# Patient Record
Sex: Female | Born: 1935 | Race: Black or African American | Hispanic: No | Smoking: Former smoker
Health system: Southern US, Community
[De-identification: ages and names within clinical notes are randomized; demographics above are authoritative.]

## PROBLEM LIST (undated history)

## (undated) DIAGNOSIS — I5189 Other ill-defined heart diseases: Secondary | ICD-10-CM

## (undated) DIAGNOSIS — I517 Cardiomegaly: Secondary | ICD-10-CM

## (undated) DIAGNOSIS — E785 Hyperlipidemia, unspecified: Secondary | ICD-10-CM

## (undated) DIAGNOSIS — I452 Bifascicular block: Secondary | ICD-10-CM

## (undated) DIAGNOSIS — I10 Essential (primary) hypertension: Secondary | ICD-10-CM

## (undated) HISTORY — DX: Cardiomegaly: I51.7

## (undated) HISTORY — DX: Other ill-defined heart diseases: I51.89

## (undated) HISTORY — DX: Bifascicular block: I45.2

## (undated) HISTORY — DX: Essential (primary) hypertension: I10

## (undated) HISTORY — DX: Hyperlipidemia, unspecified: E78.5

---

## 2005-03-07 HISTORY — PX: TRANSTHORACIC ECHOCARDIOGRAM: SHX275

## 2012-08-04 ENCOUNTER — Other Ambulatory Visit: Payer: Self-pay | Admitting: Internal Medicine

## 2012-08-26 ENCOUNTER — Other Ambulatory Visit: Payer: Self-pay | Admitting: Internal Medicine

## 2012-08-28 NOTE — Telephone Encounter (Signed)
Rx was sent to pharmacy electronically. 

## 2012-08-31 ENCOUNTER — Other Ambulatory Visit: Payer: Self-pay | Admitting: Internal Medicine

## 2012-09-03 ENCOUNTER — Other Ambulatory Visit: Payer: Self-pay | Admitting: Internal Medicine

## 2012-09-03 NOTE — Telephone Encounter (Signed)
Rx was sent to pharmacy electronically. 

## 2013-06-05 ENCOUNTER — Encounter: Payer: Self-pay | Admitting: *Deleted

## 2013-06-12 ENCOUNTER — Encounter: Payer: Self-pay | Admitting: Internal Medicine

## 2013-06-12 ENCOUNTER — Ambulatory Visit (INDEPENDENT_AMBULATORY_CARE_PROVIDER_SITE_OTHER): Payer: Medicare Other | Admitting: Internal Medicine

## 2013-06-12 VITALS — BP 120/62 | HR 78 | Ht 65.0 in | Wt 156.2 lb

## 2013-06-12 DIAGNOSIS — E785 Hyperlipidemia, unspecified: Secondary | ICD-10-CM

## 2013-06-12 DIAGNOSIS — I10 Essential (primary) hypertension: Secondary | ICD-10-CM

## 2013-06-12 DIAGNOSIS — I491 Atrial premature depolarization: Secondary | ICD-10-CM | POA: Insufficient documentation

## 2013-06-12 MED ORDER — VALSARTAN-HYDROCHLOROTHIAZIDE 320-25 MG PO TABS: ORAL_TABLET | ORAL | Status: DC

## 2013-06-12 MED ORDER — METOPROLOL SUCCINATE ER 25 MG PO TB24: ORAL_TABLET | ORAL | Status: DC

## 2013-06-12 MED ORDER — AMLODIPINE BESYLATE 10 MG PO TABS: ORAL_TABLET | ORAL | Status: DC

## 2013-06-12 NOTE — Patient Instructions (Signed)
Your physician wants you to follow-up in: 1 year. You will receive a reminder letter in the mail two months in advance. If you don't receive a letter, please call our office to schedule the follow-up appointment.  

## 2013-06-12 NOTE — Progress Notes (Signed)
OFFICE NOTE  Chief Complaint:  Routine follow-up  Primary Care Physician: No PCP Per Patient  HPI:  Gloria Odom  is a pleasant, 80 year old female. No primary care doctor. She has a history of hypertension, LVH, and diastolic dysfunction. Overall, she has done incredibly well without any hospitalizations for diastolic heart failure. She does have underlying right bifascicular block with left axis deviation of -51, which has been stable. She denies any chest pain, worsening shortness of breath, palpitations, presyncope, or syncopal symptoms. She occasionally does have some right hip pain, which she relates that it improved with Advil. Overall, for her age, she is doing remarkably well.  She is very sharp mentally!  PMHx:  Past Medical History  Diagnosis Date  . Hypertension   . LVH (left ventricular hypertrophy)   . Diastolic dysfunction   . Bifascicular block   . Dyslipidemia     Past Surgical History  Procedure Laterality Date  . Transthoracic echocardiogram  2007    EF normal; mild MR & TR; mild AV regurg & AV moderately sclerotic    FAMHx:  Family History  Problem Relation Age of Onset  . Diabetes Mother   . Kidney failure Mother   . Emphysema Father   . Stroke Maternal Grandmother   . Stroke Maternal Grandfather   . Emphysema Brother   . Asthma Sister   . Cancer Sister   . Hyperlipidemia Sister   . Heart attack Child     SOCHx:   reports that she has quit smoking. She does not have any smokeless tobacco history on file. Her alcohol and drug histories are not on file.  ALLERGIES:  No Known Allergies  ROS: A comprehensive review of systems was negative.  HOME MEDS: Current Outpatient Prescriptions  Medication Sig Dispense Refill  . amLODipine (NORVASC) 10 MG tablet TAKE 1 TABLET EVERY DAY  90 tablet  3  . aspirin 81 MG tablet Take 81 mg by mouth daily as needed.       . metoprolol succinate (TOPROL-XL) 25 MG 24 hr tablet Take 1 tablet by mouth every  day.  90 tablet  3  . Multiple Vitamin (MULTIVITAMIN) capsule Take 1 capsule by mouth daily.      . valsartan-hydrochlorothiazide (DIOVAN-HCT) 320-25 MG per tablet TAKE 1 TABLET EVERY DAY  90 tablet  3   No current facility-administered medications for this visit.    LABS/IMAGING: No results found for this or any previous visit (from the past 48 hour(s)). No results found.  VITALS: BP 120/62  Pulse 78  Ht 5\' 5"  (1.651 m)  Wt 156 lb 3.2 oz (70.852 kg)  BMI 25.99 kg/m2  EXAM: General appearance: alert and no distress Neck: no carotid bruit and no JVD Lungs: clear to auscultation bilaterally Heart: regular rate and rhythm, S1, S2 normal, no murmur, click, rub or gallop Abdomen: soft, non-tender; bowel sounds normal; no masses,  no organomegaly Extremities: extremities normal, atraumatic, no cyanosis or edema Pulses: 2+ and symmetric Skin: Skin color, texture, turgor normal. No rashes or lesions Neurologic: Grossly normal Psych: Mood, affect normal  EKG: Sinus rhythm with PACs, bifascicular block at 78  ASSESSMENT: 1. New PACs 2. Hypertension-controlled 3. Dyslipidemia-diet controlled  PLAN: 1.   Gloria Odom is doing extremely well. Her blood pressure has been well-controlled. She does have PACs on EKG today but she is unaware of this. This oftentimes can precede the development of atrial fibrillation and I asked her to watch for new or worsening fatigue.  Overall she's doing great and really has no complaints. She just turned 89 last year and continues to be very mentally sharp. Plan to see her back annually or sooner as necessary.  Gloria NoseKenneth C. Devonne Kitchen, MD, [facility]ACC Attending Cardiologist [facility]  Gloria NoseKenneth C. Laiya Odom 06/12/2013, 2:21 PM

## 2013-08-27 ENCOUNTER — Other Ambulatory Visit: Payer: Self-pay | Admitting: Internal Medicine

## 2014-01-20 ENCOUNTER — Encounter: Payer: Self-pay | Admitting: Cardiovascular Disease

## 2014-06-16 ENCOUNTER — Ambulatory Visit (INDEPENDENT_AMBULATORY_CARE_PROVIDER_SITE_OTHER): Payer: Medicare Other | Admitting: Internal Medicine

## 2014-06-16 ENCOUNTER — Encounter: Payer: Self-pay | Admitting: Internal Medicine

## 2014-06-16 VITALS — BP 108/68 | HR 71 | Ht 64.0 in | Wt 158.4 lb

## 2014-06-16 DIAGNOSIS — E785 Hyperlipidemia, unspecified: Secondary | ICD-10-CM

## 2014-06-16 DIAGNOSIS — I491 Atrial premature depolarization: Secondary | ICD-10-CM

## 2014-06-16 DIAGNOSIS — I10 Essential (primary) hypertension: Secondary | ICD-10-CM

## 2014-06-16 NOTE — Progress Notes (Signed)
OFFICE NOTE  Chief Complaint:  Routine follow-up, occasional lightheadedness and dizziness  Primary Care Physician: No PCP Per Patient  HPI:  Gloria Odom  is a pleasant, 81 year old female. No primary care doctor. She has a history of hypertension, LVH, and diastolic dysfunction. Overall, she has done incredibly well without any hospitalizations for diastolic heart failure. She does have underlying right bifascicular block with left axis deviation of -51, which has been stable. She denies any chest pain, worsening shortness of breath, palpitations, presyncope, or syncopal symptoms. She occasionally does have some right hip pain, which she relates that it improved with Advil. Overall, for her age, she is doing remarkably well.  She is very sharp mentally!  Gloria Odom returns today. She is doing incredibly well. Her birthday is coming up in a few months and she'll be 89. She denies any chest pain or worsening shortness of breath. She occasionally gets some lightheadedness and dizziness. Blood pressure was low today at 108/68.  PMHx:  Past Medical History  Diagnosis Date  . Hypertension   . LVH (left ventricular hypertrophy)   . Diastolic dysfunction   . Bifascicular block   . Dyslipidemia     Past Surgical History  Procedure Laterality Date  . Transthoracic echocardiogram  2007    EF normal; mild MR & TR; mild AV regurg & AV moderately sclerotic    FAMHx:  Family History  Problem Relation Age of Onset  . Diabetes Mother   . Kidney failure Mother   . Emphysema Father   . Stroke Maternal Grandmother   . Stroke Maternal Grandfather   . Emphysema Brother   . Asthma Sister   . Cancer Sister   . Hyperlipidemia Sister   . Heart attack Child     SOCHx:   reports that she has quit smoking. She does not have any smokeless tobacco history on file. Her alcohol and drug histories are not on file.  ALLERGIES:  No Known Allergies  ROS: A comprehensive review of systems  was negative.  HOME MEDS: Current Outpatient Prescriptions  Medication Sig Dispense Refill  . amLODipine (NORVASC) 10 MG tablet Take 5 mg by mouth daily.    Marland Kitchen. aspirin 81 MG tablet Take 81 mg by mouth daily as needed.     . metoprolol succinate (TOPROL-XL) 25 MG 24 hr tablet Take 1 tablet by mouth every day. 90 tablet 3  . Multiple Vitamin (MULTIVITAMIN) capsule Take 1 capsule by mouth daily.    . valsartan-hydrochlorothiazide (DIOVAN-HCT) 320-25 MG per tablet TAKE 1 TABLET EVERY DAY 90 tablet 3   No current facility-administered medications for this visit.    LABS/IMAGING: No results found for this or any previous visit (from the past 48 hour(s)). No results found.  VITALS: BP 108/68 mmHg  Pulse 71  Ht 5\' 4"  (1.626 m)  Wt 158 lb 6.4 oz (71.85 kg)  BMI 27.18 kg/m2  EXAM: General appearance: alert and no distress Neck: no carotid bruit and no JVD Lungs: clear to auscultation bilaterally Heart: regular rate and rhythm, S1, S2 normal, no murmur, click, rub or gallop Abdomen: soft, non-tender; bowel sounds normal; no masses,  no organomegaly Extremities: extremities normal, atraumatic, no cyanosis or edema Pulses: 2+ and symmetric Skin: Skin color, texture, turgor normal. No rashes or lesions Neurologic: Grossly normal Psych: Mood, affect normal  EKG: Sinus rhythm with PACs, bifascicular block at 71  ASSESSMENT: 1. New PACs - bifascicular block 2. Hypertension-controlled 3. Dyslipidemia-diet controlled  PLAN: 1.  Gloria Odom is doing extremely well. Blood pressure is low normal today. She occasionally gets some lightheadedness and dizziness. I'm concerned about low blood pressures at a computer fall risk. I like to decrease her amlodipine to 5 mg daily. We'll continue her other medications. I think she is doing very well given her age overall. Plan to see her back annually or sooner as necessary.  Chrystie Nose, MD, [facility] Attending Cardiologist [facility]  HILTY,Kenneth C 06/16/2014, 2:11 PM

## 2014-06-16 NOTE — Patient Instructions (Signed)
Your physician has recommended you make the following change in your medication: decrease amlodipine to 5mg  once daily   Your physician wants you to follow-up in: 1 year with Dr. Rennis GoldenHilty. You will receive a reminder letter in the mail two months in advance. If you don't receive a letter, please call our office to schedule the follow-up appointment.

## 2014-07-30 ENCOUNTER — Other Ambulatory Visit: Payer: Self-pay | Admitting: Internal Medicine

## 2014-07-30 NOTE — Telephone Encounter (Signed)
Rx has been sent to the pharmacy electronically. ° °

## 2014-08-22 ENCOUNTER — Other Ambulatory Visit: Payer: Self-pay | Admitting: Internal Medicine

## 2014-08-22 NOTE — Telephone Encounter (Signed)
Rx(s) sent to pharmacy electronically.  

## 2014-12-25 ENCOUNTER — Telehealth: Payer: Self-pay | Admitting: *Deleted

## 2014-12-25 NOTE — Telephone Encounter (Signed)
Spoke with patient/daughter and informed her that the disability parking placard form is ready for pick up - cannot walk without the use of, or assistance from, a brace, cane, crutch, another person, prosthetic device, wheelchair, or other assistive device.

## 2015-04-07 ENCOUNTER — Telehealth: Payer: Self-pay | Admitting: Internal Medicine

## 2015-04-07 NOTE — Telephone Encounter (Signed)
Pt needs a prescription to get a wheelchair.

## 2015-04-07 NOTE — Telephone Encounter (Signed)
OK TO  HAVE  WHEELCHAIR  PER  DR  HILTY  ORDER  LEFT  AT  FRONT  DESK . FOR  GRAND DAUGHTER TO PICK  UP /CY

## 2015-04-23 ENCOUNTER — Telehealth: Payer: Self-pay | Admitting: Internal Medicine

## 2015-04-23 DIAGNOSIS — Z7409 Other reduced mobility: Secondary | ICD-10-CM

## 2015-04-23 DIAGNOSIS — I5189 Other ill-defined heart diseases: Secondary | ICD-10-CM

## 2015-04-23 DIAGNOSIS — I10 Essential (primary) hypertension: Secondary | ICD-10-CM

## 2015-04-23 NOTE — Telephone Encounter (Signed)
Received a prescription on the 04-07-15 for a wheelchair. When the went to get the wheelchair,they were told it would need more details. Example: a heavy wheelchair.

## 2015-04-23 NOTE — Telephone Encounter (Signed)
Left message to call back  

## 2015-04-24 NOTE — Telephone Encounter (Signed)
Patient has not been seen since 06/2014.   On 1/31 Scherrie Bateman, LPN documented MD OK'ed wheelchair, unsure what the original prescription said - no letter in [facility], no specific documentation.   Deferred to MD to advise

## 2015-04-24 NOTE — Telephone Encounter (Signed)
Returning call from yesterday. °

## 2015-04-27 ENCOUNTER — Telehealth: Payer: Self-pay | Admitting: Internal Medicine

## 2015-04-27 NOTE — Telephone Encounter (Signed)
Ok to provide whatever kind of wheelchair they want - light folding wheelchair sounds good.  Dr. Rexene Edison

## 2015-04-27 NOTE — Telephone Encounter (Signed)
NEW MESSAGE    Pt daughter calling for Dr.Hilty rn

## 2015-04-27 NOTE — Telephone Encounter (Signed)
Order for wheelchair placed per MD specifications  Attempted to contact daughter - no answer Called patient and informed her that script for wheelchair is ready for pick up

## 2015-04-27 NOTE — Telephone Encounter (Signed)
Spoke with patient's daughter and informed her that I contact patient and let her know that Rx for wheelchair was ready for pick up  Original Rx was written on prescription pad, thus no record of what was originally ordered.

## 2015-04-30 ENCOUNTER — Telehealth: Payer: Self-pay | Admitting: Internal Medicine

## 2015-04-30 NOTE — Telephone Encounter (Signed)
Form received. Will have MD sign when he is in office.

## 2015-04-30 NOTE — Telephone Encounter (Signed)
She said she was faxing a form on this patient for documentation. She said you had faxed a form over for a wheelchair.Please be on the look out for this form.

## 2015-05-05 NOTE — Telephone Encounter (Signed)
Called patient and notified her [facility] requires an office note where the MD states the need for a wheelchair. Advised she needs to schedule her appt - due for routine 1 year OV in April. She will notify her daughter and have her call the office to arrange and appt with Dr. Rennis Golden

## 2015-05-10 ENCOUNTER — Encounter: Payer: Self-pay | Admitting: Internal Medicine

## 2015-05-10 DIAGNOSIS — R5381 Other malaise: Secondary | ICD-10-CM | POA: Insufficient documentation

## 2015-05-11 NOTE — Telephone Encounter (Signed)
Called patient to check on status of her making an MD OV for her yearly exam/wheelchair. She has not gotten info from daughter about what would be a good time for her to come in. Patient states she needs a wheelchair because she is going to a wedding and cannot transfer/walk from the wedding to the reception. She states she may could rent one. Advised her daughter call to make an appt.

## 2015-06-16 ENCOUNTER — Encounter: Payer: Self-pay | Admitting: Internal Medicine

## 2015-06-16 ENCOUNTER — Telehealth: Payer: Self-pay | Admitting: Internal Medicine

## 2015-06-16 ENCOUNTER — Ambulatory Visit (INDEPENDENT_AMBULATORY_CARE_PROVIDER_SITE_OTHER): Payer: Medicare Other | Admitting: Internal Medicine

## 2015-06-16 VITALS — BP 126/62 | HR 77 | Ht 65.0 in | Wt 154.0 lb

## 2015-06-16 DIAGNOSIS — E785 Hyperlipidemia, unspecified: Secondary | ICD-10-CM

## 2015-06-16 DIAGNOSIS — I352 Nonrheumatic aortic (valve) stenosis with insufficiency: Secondary | ICD-10-CM | POA: Diagnosis not present

## 2015-06-16 DIAGNOSIS — I10 Essential (primary) hypertension: Secondary | ICD-10-CM | POA: Diagnosis not present

## 2015-06-16 DIAGNOSIS — R5381 Other malaise: Secondary | ICD-10-CM

## 2015-06-16 NOTE — Telephone Encounter (Signed)
New Message  Request a call to discuss the referral for a wheel chair. Will fax over the request to call back as well. No further details

## 2015-06-16 NOTE — Telephone Encounter (Signed)
Order faxed for patient's wheelchair.

## 2015-06-16 NOTE — Patient Instructions (Signed)
Your physician wants you to follow-up in: 1 year with Dr. Hilty. You will receive a reminder letter in the mail two months in advance. If you don't receive a letter, please call our office to schedule the follow-up appointment.  

## 2015-06-16 NOTE — Progress Notes (Signed)
OFFICE NOTE  Chief Complaint:  Routine follow-up, difficulty ambulating  Primary Care Physician: No PCP Per Patient  HPI:  Gloria Odom  is a pleasant, 82 year old female. No primary care doctor. She has a history of hypertension, LVH, and diastolic dysfunction. Overall, she has done incredibly well without any hospitalizations for diastolic heart failure. She does have underlying right bifascicular block with left axis deviation of -51, which has been stable. She denies any chest pain, worsening shortness of breath, palpitations, presyncope, or syncopal symptoms. She occasionally does have some right hip pain, which she relates that it improved with Advil. Overall, for her age, she is doing remarkably well.  She is very sharp mentally!  Ms. Tortorelli returns today. She is doing incredibly well. Her birthday is coming up in a few months and she'll be 89. She denies any chest pain or worsening shortness of breath. She occasionally gets some lightheadedness and dizziness. Blood pressure was low today at 108/68.  Ms. Gianino returns today for follow-up. She seems to be doing well although is slowing somewhat. She says she can only walk about 500 feet before she has to stop because of some weakness. Otherwise blood pressure seems to be well-controlled. Today it's 126/62. She ambulates with a cane and has difficulty getting in and out of stores. She's desiring a wheelchair to help with ambulation. Her granddaughter generally takes her around and therefore the wheelchair needs to be light weight. A walker would not resolve the issue of activities of daily living because she cannot walk long distances. She also has generalized weakness and is unable to propel herself in a standard heavy wheelchair. Also of note, she has both AS and AI murmurs today. A review of her last echocardiogram was in 2007 which showed moderate aortic sclerosis with mild AI. I believe she is now developed some mild aortic  stenosis. We discussed a repeat echocardiogram however given her deconditioning and the fact that she is not interested in any surgical procedures, I do not see the relevance of repeating an echocardiogram. I will continue to follow this clinically.  PMHx:  Past Medical History  Diagnosis Date  . Hypertension   . LVH (left ventricular hypertrophy)   . Diastolic dysfunction   . Bifascicular block   . Dyslipidemia     Past Surgical History  Procedure Laterality Date  . Transthoracic echocardiogram  2007    EF normal; mild MR & TR; mild AV regurg & AV moderately sclerotic    FAMHx:  Family History  Problem Relation Age of Onset  . Diabetes Mother   . Kidney failure Mother   . Emphysema Father   . Stroke Maternal Grandmother   . Stroke Maternal Grandfather   . Emphysema Brother   . Asthma Sister   . Cancer Sister   . Hyperlipidemia Sister   . Heart attack Child     SOCHx:   reports that she has quit smoking. She does not have any smokeless tobacco history on file. Her alcohol and drug histories are not on file.  ALLERGIES:  No Known Allergies  ROS: A comprehensive review of systems was negative.  HOME MEDS: Current Outpatient Prescriptions  Medication Sig Dispense Refill  . amLODipine (NORVASC) 10 MG tablet TAKE 1 TABLET EVERY DAY 90 tablet 3  . aspirin 81 MG tablet Take 81 mg by mouth daily as needed.     . metoprolol succinate (TOPROL-XL) 25 MG 24 hr tablet TAKE 1 TABLET BY MOUTH EVERY DAY. 90  tablet 3  . Multiple Vitamin (MULTIVITAMIN) capsule Take 1 capsule by mouth daily.    . valsartan-hydrochlorothiazide (DIOVAN-HCT) 320-25 MG per tablet TAKE 1 TABLET EVERY DAY 90 tablet 3   No current facility-administered medications for this visit.    LABS/IMAGING: No results found for this or any previous visit (from the past 48 hour(s)). No results found.  VITALS: BP 126/62 mmHg  Pulse 77  Ht 5\' 5"  (1.651 m)  Wt 154 lb (69.854 kg)  BMI 25.63  kg/m2  EXAM: General appearance: alert and no distress Neck: no carotid bruit and no JVD Lungs: clear to auscultation bilaterally Heart: regular rate and rhythm, S1, S2 normal and systolic murmur: systolic ejection 3/6, crescendo at 2nd right intercostal space Abdomen: soft, non-tender; bowel sounds normal; no masses,  no organomegaly Extremities: extremities normal, atraumatic, no cyanosis or edema Pulses: 2+ and symmetric Skin: Skin color, texture, turgor normal. No rashes or lesions Neurologic: Grossly normal Psych: Mood, affect normal  EKG: Sinus rhythm with PACs at 77, bifascicular block  ASSESSMENT: 1. Bifascicular block 2. PACs 3. Physical deconditioning 4. Mild aortic stenosis and insufficiency 5. Hypertension-controlled 6. Dyslipidemia-diet controlled  PLAN: 1.   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 Blood pressure at goal today. She occasionally gets some lightheadedness and dizziness, but has not had falls. Her EKG is stable. Exam today demonstrates probable mild aortic stenosis with aortic insufficiency. Last echo was in 2007 we discussed repeating that however given her advanced age and her disinterest in surgical procedures, did not see clear utility in that at this time. She denies shortness of breath or any chest pain. Blood pressure seems well controlled. Plan follow-up annually or sooner as necessary. As discussed above, I feel that she is a good candidate for a light weight wheelchair for the aforementioned reasons.  Chrystie NoseKenneth C. Liliani Bobo, MD, [facility]ACC Attending Cardiologist [facility]  Lisette AbuKenneth C Nikolaos Maddocks 06/16/2015, 8:30 AM

## 2015-06-16 NOTE — Telephone Encounter (Signed)
LM for Marylene Landngela to return call

## 2015-06-16 NOTE — Telephone Encounter (Signed)
Gloria Odom returned call. She states medicare requires that 4 accessories be selected for wheelchair orders. Will pass order along to MD to advise

## 2015-06-16 NOTE — Telephone Encounter (Signed)
Faxed order for light folding wheelchair with MD [facility] note [facility]

## 2015-07-29 ENCOUNTER — Other Ambulatory Visit: Payer: Self-pay | Admitting: Internal Medicine

## 2015-08-15 ENCOUNTER — Other Ambulatory Visit: Payer: Self-pay | Admitting: Internal Medicine

## 2015-08-17 NOTE — Telephone Encounter (Signed)
Rx request sent to pharmacy.  

## 2015-08-18 ENCOUNTER — Emergency Department (HOSPITAL_COMMUNITY)
Admission: EM | Admit: 2015-08-18 | Discharge: 2015-08-18 | Disposition: A | Discharge status: Home / Self Care | Payer: Medicare Other | Attending: Emergency Medicine | Admitting: Emergency Medicine

## 2015-08-18 ENCOUNTER — Encounter (HOSPITAL_COMMUNITY): Payer: Self-pay

## 2015-08-18 ENCOUNTER — Telehealth: Payer: Self-pay | Admitting: Internal Medicine

## 2015-08-18 ENCOUNTER — Emergency Department (HOSPITAL_COMMUNITY): Payer: Medicare Other

## 2015-08-18 DIAGNOSIS — Z87891 Personal history of nicotine dependence: Secondary | ICD-10-CM | POA: Insufficient documentation

## 2015-08-18 DIAGNOSIS — N939 Abnormal uterine and vaginal bleeding, unspecified: Secondary | ICD-10-CM | POA: Insufficient documentation

## 2015-08-18 DIAGNOSIS — E785 Hyperlipidemia, unspecified: Secondary | ICD-10-CM | POA: Insufficient documentation

## 2015-08-18 DIAGNOSIS — Z79899 Other long term (current) drug therapy: Secondary | ICD-10-CM | POA: Diagnosis not present

## 2015-08-18 DIAGNOSIS — Z7982 Long term (current) use of aspirin: Secondary | ICD-10-CM | POA: Diagnosis not present

## 2015-08-18 DIAGNOSIS — I10 Essential (primary) hypertension: Secondary | ICD-10-CM | POA: Insufficient documentation

## 2015-08-18 DIAGNOSIS — N950 Postmenopausal bleeding: Secondary | ICD-10-CM

## 2015-08-18 LAB — COMPREHENSIVE METABOLIC PANEL
ALBUMIN: 4.8 g/dL (ref 3.5–5.0)
ALK PHOS: 77 U/L (ref 38–126)
ALT: 10 U/L — AB (ref 14–54)
AST: 19 U/L (ref 15–41)
Anion gap: 11 (ref 5–15)
BUN: 20 mg/dL (ref 6–20)
CALCIUM: 9.7 mg/dL (ref 8.9–10.3)
CHLORIDE: 95 mmol/L — AB (ref 101–111)
CO2: 27 mmol/L (ref 22–32)
CREATININE: 1.06 mg/dL — AB (ref 0.44–1.00)
GFR calc non Af Amer: 44 mL/min — ABNORMAL LOW (ref >60)
GFR, EST AFRICAN AMERICAN: 51 mL/min — AB (ref >60)
GLUCOSE: 98 mg/dL (ref 65–99)
Potassium: 3.4 mmol/L — ABNORMAL LOW (ref 3.5–5.1)
SODIUM: 133 mmol/L — AB (ref 135–145)
Total Bilirubin: 0.7 mg/dL (ref 0.3–1.2)
Total Protein: 8 g/dL (ref 6.5–8.1)

## 2015-08-18 LAB — TYPE AND SCREEN
ABO/RH(D): B POS
ANTIBODY SCREEN: NEGATIVE

## 2015-08-18 LAB — CBC WITH DIFFERENTIAL/PLATELET
BASOS ABS: 0 10*3/uL (ref 0.0–0.1)
BASOS PCT: 0 %
EOS ABS: 0.3 10*3/uL (ref 0.0–0.7)
Eosinophils Relative: 4 %
HCT: 35.1 % — ABNORMAL LOW (ref 36.0–46.0)
HEMOGLOBIN: 11.8 g/dL — AB (ref 12.0–15.0)
Lymphocytes Relative: 20 %
Lymphs Abs: 1.3 10*3/uL (ref 0.7–4.0)
MCH: 25.7 pg — ABNORMAL LOW (ref 26.0–34.0)
MCHC: 33.6 g/dL (ref 30.0–36.0)
MCV: 76.3 fL — ABNORMAL LOW (ref 78.0–100.0)
MONO ABS: 0.6 10*3/uL (ref 0.1–1.0)
MONOS PCT: 9 %
NEUTROS PCT: 67 %
Neutro Abs: 4.2 10*3/uL (ref 1.7–7.7)
PLATELETS: 397 10*3/uL (ref 150–400)
RBC: 4.6 MIL/uL (ref 3.87–5.11)
RDW: 13.6 % (ref 11.5–15.5)
WBC: 6.5 10*3/uL (ref 4.0–10.5)

## 2015-08-18 LAB — URINALYSIS, ROUTINE W REFLEX MICROSCOPIC
Bilirubin Urine: NEGATIVE
Glucose, UA: NEGATIVE mg/dL
Ketones, ur: NEGATIVE mg/dL
LEUKOCYTES UA: NEGATIVE
Nitrite: NEGATIVE
PROTEIN: NEGATIVE mg/dL
SPECIFIC GRAVITY, URINE: 1.015 (ref 1.005–1.030)
pH: 6 (ref 5.0–8.0)

## 2015-08-18 LAB — URINE MICROSCOPIC-ADD ON

## 2015-08-18 LAB — ABO/RH: ABO/RH(D): B POS

## 2015-08-18 NOTE — ED Provider Notes (Addendum)
CSN: [id number]     Arrival date & time 08/18/15  1310 History   First MD Initiated Contact with Patient 08/18/15 1348     Chief Complaint  Patient presents with  . Vaginal Bleeding     (Consider location/radiation/quality/duration/timing/severity/associated sxs/prior Treatment) Patient is a 82 y.o. female presenting with vaginal bleeding. The history is provided by the patient and a relative.  Vaginal Bleeding Quality:  Dark red Severity:  Moderate Onset quality:  Sudden Duration:  2 days Timing:  Constant Progression:  Worsening Chronicity:  New Menstrual history:  Postmenopausal Possible pregnancy: no   Context: not foreign body and not genital trauma   Relieved by:  Nothing Worsened by:  Nothing tried Ineffective treatments:  None tried Associated symptoms: no abdominal pain, no fatigue and no fever     Past Medical History  Diagnosis Date  . Hypertension   . LVH (left ventricular hypertrophy)   . Diastolic dysfunction   . Bifascicular block   . Dyslipidemia    Past Surgical History  Procedure Laterality Date  . Transthoracic echocardiogram  2007    EF normal; mild MR & TR; mild AV regurg & AV moderately sclerotic   Family History  Problem Relation Age of Onset  . Diabetes Mother   . Kidney failure Mother   . Emphysema Father   . Stroke Maternal Grandmother   . Stroke Maternal Grandfather   . Emphysema Brother   . Asthma Sister   . Cancer Sister   . Hyperlipidemia Sister   . Heart attack Child    Social History  Substance Use Topics  . Smoking status: Former Games developermoker  . Smokeless tobacco: None  . Alcohol Use: None   OB History    No data available     Review of Systems  Constitutional: Negative for fever and fatigue.  Gastrointestinal: Negative for abdominal pain.  Genitourinary: Positive for vaginal bleeding.  All other systems reviewed and are negative.     Allergies  Review of patient's allergies indicates no known allergies.  Home  Medications   Prior to Admission medications   Medication Sig Start Date End Date Taking? Authorizing Provider  amLODipine (NORVASC) 10 MG tablet TAKE 1 TABLET EVERY DAY 07/29/15   Chrystie NoseKenneth C Hilty, MD  aspirin 81 MG tablet Take 81 mg by mouth daily as needed.     Historical Provider, MD  metoprolol succinate (TOPROL-XL) 25 MG 24 hr tablet TAKE 1 TABLET BY MOUTH EVERY DAY. 08/17/15   Chrystie NoseKenneth C Hilty, MD  Multiple Vitamin (MULTIVITAMIN) capsule Take 1 capsule by mouth daily.    Historical Provider, MD  valsartan-hydrochlorothiazide (DIOVAN-HCT) 320-25 MG tablet TAKE 1 TABLET EVERY DAY 07/29/15   Chrystie NoseKenneth C Hilty, MD   BP 159/82 mmHg  Pulse 78  Temp(Src) 99.2 F (37.3 C) (Oral)  Resp 20  SpO2 98% Physical Exam  Constitutional: She is oriented to person, place, and time. She appears well-developed and well-nourished.  Non-toxic appearance. No distress.  HENT:  Head: Normocephalic and atraumatic.  Eyes: Conjunctivae, EOM and lids are normal. Pupils are equal, round, and reactive to light.  Neck: Normal range of motion. Neck supple. No tracheal deviation present. No thyroid mass present.  Cardiovascular: Normal rate, regular rhythm and normal heart sounds.  Exam reveals no gallop.   No murmur heard. Pulmonary/Chest: Effort normal and breath sounds normal. No stridor. No respiratory distress. She has no decreased breath sounds. She has no wheezes. She has no rhonchi. She has no rales.  Abdominal: Soft.  Normal appearance and bowel sounds are normal. She exhibits no distension. There is no tenderness. There is no rebound and no CVA tenderness.  Genitourinary: There is bleeding in the vagina.  Musculoskeletal: Normal range of motion. She exhibits no edema or tenderness.  Neurological: She is alert and oriented to person, place, and time. She has normal strength. No cranial nerve deficit or sensory deficit. GCS eye subscore is 4. GCS verbal subscore is 5. GCS motor subscore is 6.  Skin: Skin is warm  and dry. No abrasion and no rash noted.  Psychiatric: She has a normal mood and affect. Her speech is normal and behavior is normal.  Nursing note and vitals reviewed.   ED Course  Procedures (including critical care time) Labs Review Labs Reviewed  URINALYSIS, ROUTINE W REFLEX MICROSCOPIC (NOT AT [facility])  CBC WITH DIFFERENTIAL/PLATELET  COMPREHENSIVE METABOLIC PANEL  TYPE AND SCREEN    Imaging Review No results found. I have personally reviewed and evaluated these images and lab results as part of my medical decision-making.   EKG Interpretation None      MDM   Final diagnoses:  None   Patient's pelvic ultrasound results reviewed with her as well as with her gynecologist on call, Dr. Debroah Loop. He will call the patient this week to schedule a follow-up visit to rule out gynecological pathology    Lorre Nick, MD 08/18/15 1609  Lorre Nick, MD 08/18/15 1616

## 2015-08-18 NOTE — Telephone Encounter (Signed)
Returned call to patient. She had blood in her urine this morning - light pink - to red. Patient did not take ASA yesterday. She takes ASA as needed. Explained that blood thinners/antiplatelets generally do not cause the bleeding but can exacerbate an underlying issue, but since she is not taking either (consistently) it is likely another issue. Patient states she is having lower back pain as well. Advised patient to seek eval from PCP or urgent care - she does not have PCP - she will likely need urine test/blood work. She voiced understanding.

## 2015-08-18 NOTE — Telephone Encounter (Signed)
Pt called in stating that she has been having some blood in her urine. She wanted to know if this is something she should be concerned about? Please f/u with her.  Thanks

## 2015-08-18 NOTE — ED Notes (Signed)
Pt with rt flank pain and hematuria.  Pain and bleeding x 2 days.  Went to urgent care and told to come to ED.  No fever.  No n/v

## 2015-08-18 NOTE — Discharge Instructions (Signed)

## 2015-09-07 ENCOUNTER — Encounter: Payer: Self-pay | Admitting: Obstetrics & Gynecology

## 2015-09-07 ENCOUNTER — Ambulatory Visit (INDEPENDENT_AMBULATORY_CARE_PROVIDER_SITE_OTHER): Payer: Medicare Other | Admitting: Obstetrics & Gynecology

## 2015-09-07 VITALS — BP 174/80 | HR 66 | Temp 97.9°F

## 2015-09-07 DIAGNOSIS — N950 Postmenopausal bleeding: Secondary | ICD-10-CM

## 2015-09-07 NOTE — Progress Notes (Signed)
Patient ID: Gloria EtienneLelia Odom, female   DOB: January 09, 1921, 82 y.o.   MRN: [id number]  Chief Complaint  Patient presents with  . Postmenopausal bleeding  [facility]  HPI Gloria Odom is a 82 y.o. female.  [id number] No LMP recorded. Patient is postmenopausal. One day of vaginal bleeding noted and was seen in ED. US was done. No bleeding since and no pain or discharge  HPI  Past Medical History  Diagnosis Date  . Hypertension   . LVH (left ventricular hypertrophy)   . Diastolic dysfunction   . Bifascicular block   . Dyslipidemia     Past Surgical History  Procedure Laterality Date  . Transthoracic echocardiogram  2007    EF normal; mild MR & TR; mild AV regurg & AV moderately sclerotic    Family History  Problem Relation Age of Onset  . Diabetes Mother   . Kidney failure Mother   . Emphysema Father   . Stroke Maternal Grandmother   . Stroke Maternal Grandfather   . Emphysema Brother   . Asthma Sister   . Cancer Sister   . Hyperlipidemia Sister   . Heart attack Child     Social History Social History  Substance Use Topics  . Smoking status: Former Games developermoker  . Smokeless tobacco: Never Used  . Alcohol Use: No    No Known Allergies  Current Outpatient Prescriptions  Medication Sig Dispense Refill  . amLODipine (NORVASC) 10 MG tablet TAKE 1 TABLET EVERY DAY 90 tablet 3  . aspirin EC 81 MG tablet Take 81 mg by mouth daily.    . diphenhydrAMINE (BENADRYL) 25 mg capsule Take 50 mg by mouth at bedtime as needed for itching.    . metoprolol succinate (TOPROL-XL) 25 MG 24 hr tablet TAKE 1 TABLET BY MOUTH EVERY DAY. 90 tablet 3  . Multiple Vitamins-Minerals (CENTRUM SILVER ADULT 50+) TABS Take 1 tablet by mouth daily.    . naproxen sodium (ANAPROX) 220 MG tablet Take 440 mg by mouth daily as needed (for pain).    . valsartan-hydrochlorothiazide (DIOVAN-HCT) 320-25 MG tablet TAKE 1 TABLET EVERY DAY 90 tablet 3   No current facility-administered medications for this  visit.    Review of Systems Review of Systems  Constitutional: Positive for fatigue.  Respiratory: Negative.   Genitourinary: Negative for vaginal bleeding, vaginal discharge and pelvic pain.    Blood pressure 174/80, pulse 66, temperature 97.9 F (36.6 C).  Physical Exam Physical Exam  Constitutional: She is oriented to person, place, and time. She appears well-developed. No distress.  Pulmonary/Chest: Effort normal.  Abdominal: Soft. She exhibits no distension and no mass. There is no tenderness.  Genitourinary:  deferred  Neurological: She is alert and oriented to person, place, and time.  Psychiatric: She has a normal mood and affect. Her behavior is normal.    Data Reviewed CLINICAL DATA: Postmenopausal bleeding  EXAM: TRANSABDOMINAL AND TRANSVAGINAL ULTRASOUND OF PELVIS  TECHNIQUE: Study was performed transabdominally to optimize pelvic field of view and transvaginally to optimize internal visceral architecture evaluation.  COMPARISON: None  FINDINGS: Uterus  Measurements: 6.9 x 4.3 x 4.7 cm. The uterus has an inhomogeneous echotexture. There are intrauterine masses consistent with leiomyomas. In the uterine fundus region, there is a 1.6 x 1.2 x 1.7 cm mass which contains multiple foci of calcification. There is a partially calcified mass in the posterior fundal region measuring 2.6 x 2.3 x 2.2 cm. There is a nearby second partially calcified mass measuring 3.4 x 3.0  x 2.8 cm. There may be other smaller intermingled leiomyomas.  Endometrium  Thickness: 15 mm in the miduterine region. The endometrium narrows to 6 mm in the lower uterine segment.  Right ovary  Normal ovary not seen by either transabdominal or transvaginal technique. No right-sided pelvic mass seen.  Left ovary  Normal left ovary not appreciable. There is a hypoechoic left adnexal mass measuring 5.2 x 4.7 x 4.7 cm.  Other findings  There is a small amount of free  pelvic fluid.  IMPRESSION: 1. Solid mass in the left adnexal region, concerning for ovarian neoplasm.  2. Endometrium measures 15 mm in maximum diameter. Endometrial thickness is considered abnormal for a post-menopausal female. Endometrial sampling should be considered to exclude carcinoma.  3. Leiomyomatous uterus.  4. Small amount of free pelvic fluid of uncertain etiology.  Given concern for ovarian neoplasm, it may be prudent to correlate with CT or MRI with intravenous contrast for further evaluation and to assess for potential metastatic disease.   Electronically Signed  By: Bretta BangWilliam Woodruff III M.D.  On: 08/18/2015 15:27   Assessment    Patient Active Problem List   Diagnosis Date Noted  . Aortic insufficiency with aortic stenosis 06/16/2015  . Physical deconditioning 05/10/2015  . HTN (hypertension) 06/12/2013  . Dyslipidemia 06/12/2013  . PAC (premature atrial contraction) 06/12/2013   Fibroid uterus, possible left adnexal solid mass, suspicious for fibroid on images I viewed   Episode of postmenopausal bleeding resolved Extreme advanced age Plan    Expectant management agreed upon with daughter and grandchild present for discussion, report recurrent symptom, no biopsy done today as surgical management not desired        ARNOLD,JAMES 09/07/2015, 5:04 PM

## 2015-09-07 NOTE — Patient Instructions (Signed)
Postmenopausal Bleeding Postmenopausal bleeding is any bleeding a woman has after she has entered into menopause. Menopause is the end of a woman's fertile years. After menopause, a woman no longer ovulates or has menstrual periods.  Postmenopausal bleeding can be caused by various things. Any type of postmenopausal bleeding, even if it appears to be a typical menstrual period, is concerning. This should be evaluated by your health care provider. Any treatment will depend on the cause of the bleeding. HOME CARE INSTRUCTIONS Monitor your condition for any changes. The following actions may help to alleviate any discomfort you are experiencing:  Avoid the use of tampons and douches as directed by your health care provider.  Change your pads frequently.  Get regular pelvic exams and Pap tests.  Keep all follow-up appointments for diagnostic tests as directed by your health care provider. SEEK MEDICAL CARE IF:   Your bleeding lasts more than 1 week.  You have abdominal pain.  You have bleeding with sexual intercourse. SEEK IMMEDIATE MEDICAL CARE IF:   You have a fever, chills, headache, dizziness, muscle aches, and bleeding.  You have severe pain with bleeding.  You are passing blood clots.  You have bleeding and need more than 1 pad an hour.  You feel faint. MAKE SURE YOU:  Understand these instructions.  Will watch your condition.  Will get help right away if you are not doing well or get worse.   This information is not intended to replace advice given to you by your health care provider. Make sure you discuss any questions you have with your health care provider.   Document Released: 06/01/2005 Document Revised: 12/12/2012 Document Reviewed: 09/20/2012 Elsevier Interactive Patient Education 2016 [employer] Inc[employer]  

## 2016-07-24 ENCOUNTER — Other Ambulatory Visit: Payer: Self-pay | Admitting: Internal Medicine

## 2016-07-25 NOTE — Telephone Encounter (Signed)
Rx request sent to pharmacy.  

## 2016-08-03 ENCOUNTER — Encounter: Payer: Self-pay | Admitting: Internal Medicine

## 2016-08-03 ENCOUNTER — Ambulatory Visit (INDEPENDENT_AMBULATORY_CARE_PROVIDER_SITE_OTHER): Payer: Medicare Other | Admitting: Internal Medicine

## 2016-08-03 VITALS — BP 126/60 | HR 108 | Ht 65.0 in

## 2016-08-03 DIAGNOSIS — R1110 Vomiting, unspecified: Secondary | ICD-10-CM | POA: Diagnosis not present

## 2016-08-03 DIAGNOSIS — R6883 Chills (without fever): Secondary | ICD-10-CM

## 2016-08-03 DIAGNOSIS — R630 Anorexia: Secondary | ICD-10-CM | POA: Diagnosis not present

## 2016-08-03 DIAGNOSIS — R5383 Other fatigue: Secondary | ICD-10-CM | POA: Diagnosis not present

## 2016-08-03 DIAGNOSIS — I352 Nonrheumatic aortic (valve) stenosis with insufficiency: Secondary | ICD-10-CM | POA: Diagnosis not present

## 2016-08-03 NOTE — Patient Instructions (Addendum)
Your physician recommends that you return for lab work - CBC, CMET, BNP, Lipase  Please call if you would like to schedule an ECHOCARDIOGRAM  Dr. Rennis GoldenHilty recommends increasing fluid intake.   Your physician recommends that you schedule a follow-up appointment in TWO WEEKS with Dr. Rennis GoldenHilty.

## 2016-08-03 NOTE — Progress Notes (Signed)
OFFICE NOTE  Chief Complaint:  Recent vomiting, anorexia, fatigue  Primary Care Physician: Patient, No Pcp Per  HPI:  Gloria Odom  is a pleasant, 83 year old female. No primary care doctor. She has a history of hypertension, LVH, and diastolic dysfunction. Overall, she has done incredibly well without any hospitalizations for diastolic heart failure. She does have underlying right bifascicular block with left axis deviation of -51, which has been stable. She denies any chest pain, worsening shortness of breath, palpitations, presyncope, or syncopal symptoms. She occasionally does have some right hip pain, which she relates that it improved with Advil. Overall, for her age, she is doing remarkably well.  She is very sharp mentally!  Gloria Odom returns today. She is doing incredibly well. Her birthday is coming up in a few months and she'll be 89. She denies any chest pain or worsening shortness of breath. She occasionally gets some lightheadedness and dizziness. Blood pressure was low today at 108/68.  Gloria Odom returns today for follow-up. She seems to be doing well although is slowing somewhat. She says she can only walk about 500 feet before she has to stop because of some weakness. Otherwise blood pressure seems to be well-controlled. Today it's 126/62. She ambulates with a cane and has difficulty getting in and out of stores. She's desiring a wheelchair to help with ambulation. Her granddaughter generally takes her around and therefore the wheelchair needs to be light weight. A walker would not resolve the issue of activities of daily living because she cannot walk long distances. She also has generalized weakness and is unable to propel herself in a standard heavy wheelchair. Also of note, she has both AS and AI murmurs today. A review of her last echocardiogram was in 2007 which showed moderate aortic sclerosis with mild AI. I believe she is now developed some mild aortic stenosis.  We discussed a repeat echocardiogram however given her deconditioning and the fact that she is not interested in any surgical procedures, I do not see the relevance of repeating an echocardiogram. I will continue to follow this clinically.  08/03/2016  Gloria Odom returns today for follow-up. She is accompanied by her daughter and granddaughter. This is an annual visit. She does not have a primary care provider. When I last saw her a year ago we discussed her aortic stenosis. In 2007 she had moderate aortic stenosis and clinically had moderate to severe aortic stenosis on exam last year. After discussion with her family with the term and that she would not 1 to have surgery or intervention and an echocardiogram therefore was not pursued. Today her family and the patient indicates that this past Sunday she became ill. This was acute included vomiting 1 and has had anorexia since then. In addition she's had fatigue without any significant shortness of breath or chest pain. She denies fevers, cough, diarrhea or sick contacts but has had chills. Appetite is been decreased significantly. She reports good urine output and denies any dysuria. She has had some occasional vaginal spotting which was noted a year ago. She was apparently seen in the walk in GYN clinic for this and told there was not much that could be done. She is noted to be tachycardic today with a heart rate of 108 and blood pressure normal 126/60. She reports she did not take her metoprolol today. Family noted however she has been tachycardic recently despite taking her medications.  PMHx:  Past Medical History:  Diagnosis Date  . Bifascicular  block   . Diastolic dysfunction   . Dyslipidemia   . Hypertension   . LVH (left ventricular hypertrophy)     Past Surgical History:  Procedure Laterality Date  . TRANSTHORACIC ECHOCARDIOGRAM  2007   EF normal; mild MR & TR; mild AV regurg & AV moderately sclerotic    FAMHx:  Family History    Problem Relation Age of Onset  . Diabetes Mother   . Kidney failure Mother   . Emphysema Father   . Stroke Maternal Grandmother   . Stroke Maternal Grandfather   . Emphysema Brother   . Asthma Sister   . Cancer Sister   . Hyperlipidemia Sister   . Heart attack Child     SOCHx:   reports that she has quit smoking. She has never used smokeless tobacco. She reports that she does not drink alcohol or use drugs.  ALLERGIES:  No Known Allergies  ROS: Pertinent items noted in HPI and remainder of comprehensive ROS otherwise negative.  HOME MEDS: Current Outpatient Prescriptions  Medication Sig Dispense Refill  . amLODipine (NORVASC) 10 MG tablet TAKE 1 TABLET EVERY DAY 90 tablet 3  . aspirin EC 81 MG tablet Take 81 mg by mouth daily.    . diphenhydrAMINE (BENADRYL) 25 mg capsule Take 50 mg by mouth at bedtime as needed for itching.    . metoprolol succinate (TOPROL-XL) 25 MG 24 hr tablet TAKE 1 TABLET BY MOUTH EVERY DAY. 90 tablet 3  . Multiple Vitamins-Minerals (CENTRUM SILVER ADULT 50+) TABS Take 1 tablet by mouth daily.    . naproxen sodium (ANAPROX) 220 MG tablet Take 440 mg by mouth daily as needed (for pain).    . valsartan-hydrochlorothiazide (DIOVAN-HCT) 320-25 MG tablet TAKE 1 TABLET EVERY DAY 90 tablet 3   No current facility-administered medications for this visit.     LABS/IMAGING: No results found for this or any previous visit (from the past 48 hour(s)). No results found.  VITALS: BP 126/60   Pulse (!) 108   Ht 5\' 5"  (1.651 m)   EXAM: General appearance: alert, mild distress and Rest hand tremor bilaterally Neck: no carotid bruit, no JVD and Dry mucous membrane Lungs: diminished breath sounds bibasilar Heart: systolic murmur: late systolic 3/6, crescendo at 2nd right intercostal space and Regular tachycardia Abdomen: soft, non-tender; bowel sounds normal; no masses,  no organomegaly Extremities: extremities normal, atraumatic, no cyanosis or edema Pulses:  2+ and symmetric Skin: Skin color, texture, turgor normal. No rashes or lesions Neurologic: Grossly normal Psych: Mood, affect normal  EKG: Sinus tachycardia at 108, bifascicular block  ASSESSMENT: 1. Vomiting, fatigue and anorexia 2. Bifascicular block 3. PACs 4. Physical deconditioning 5. Moderate to severe clinical aortic stenosis on exam 6. Hypertension-controlled 7. Dyslipidemia-diet controlled  PLAN: 1.   Mrs. Lacher had a recent episode of vomiting, fatigue and has had anorexia this past week. She is tachycardic today and exam and did not take her beta blocker today however I suspect his other reasons for this. She does have a more significant systolic ejection murmur which is late peaking and suggestive of likely severe aortic stenosis. This could explain her tachycardia she has reduced cardiac output. Despite this, she denies any worsening shortness of breath or chest pain and has not had any syncopal or presyncopal events. She's felt chills, but no fever and denies any abdominal pain, dysuria or other associated symptoms. I recommend blood work today including CBC, CMET, BNP and a lipase, I will discuss the findings with  the family and they will consider whether or not she wants to have an echocardiogram to assess her aortic valve gradient. Based on those findings I could better prognosticate her aortic stenosis. Family again mentioned that she would not want to have open heart surgery for her aortic valve, and I did discuss an option of possibly replacing it with a transcatheter valve. She may not be an ideal candidate for that but that is the only other option. I explained to the family that the natural course of aortic stenosis is worsening the point where it is a fatal disease if not surgically corrected. Family will consider the echocardiogram when we discuss in follow-up.  Plan to see back in 2 weeks.  Chrystie NoseKenneth C. Latausha Flamm, MD, [facility] Attending Cardiologist [facility]  Chrystie NoseKenneth C Louis Ivery 08/03/2016, 4:57 PM

## 2016-08-04 ENCOUNTER — Emergency Department (HOSPITAL_COMMUNITY): Payer: Medicare Other

## 2016-08-04 ENCOUNTER — Encounter (HOSPITAL_COMMUNITY): Payer: Self-pay

## 2016-08-04 ENCOUNTER — Observation Stay (HOSPITAL_COMMUNITY)
Admission: EM | Admit: 2016-08-04 | Discharge: 2016-08-06 | Disposition: A | Discharge status: Home / Self Care | Payer: Medicare Other | Attending: Internal Medicine | Admitting: Internal Medicine

## 2016-08-04 DIAGNOSIS — R7989 Other specified abnormal findings of blood chemistry: Secondary | ICD-10-CM | POA: Diagnosis not present

## 2016-08-04 DIAGNOSIS — I352 Nonrheumatic aortic (valve) stenosis with insufficiency: Secondary | ICD-10-CM | POA: Diagnosis present

## 2016-08-04 DIAGNOSIS — N3000 Acute cystitis without hematuria: Secondary | ICD-10-CM

## 2016-08-04 DIAGNOSIS — I1310 Hypertensive heart and chronic kidney disease without heart failure, with stage 1 through stage 4 chronic kidney disease, or unspecified chronic kidney disease: Secondary | ICD-10-CM | POA: Insufficient documentation

## 2016-08-04 DIAGNOSIS — D509 Iron deficiency anemia, unspecified: Secondary | ICD-10-CM | POA: Diagnosis not present

## 2016-08-04 DIAGNOSIS — E785 Hyperlipidemia, unspecified: Secondary | ICD-10-CM | POA: Insufficient documentation

## 2016-08-04 DIAGNOSIS — N183 Chronic kidney disease, stage 3 (moderate): Secondary | ICD-10-CM | POA: Diagnosis not present

## 2016-08-04 DIAGNOSIS — E876 Hypokalemia: Secondary | ICD-10-CM | POA: Insufficient documentation

## 2016-08-04 DIAGNOSIS — E860 Dehydration: Secondary | ICD-10-CM | POA: Diagnosis not present

## 2016-08-04 DIAGNOSIS — Z87891 Personal history of nicotine dependence: Secondary | ICD-10-CM | POA: Diagnosis not present

## 2016-08-04 DIAGNOSIS — Z7982 Long term (current) use of aspirin: Secondary | ICD-10-CM | POA: Diagnosis not present

## 2016-08-04 DIAGNOSIS — Z79899 Other long term (current) drug therapy: Secondary | ICD-10-CM | POA: Diagnosis not present

## 2016-08-04 DIAGNOSIS — N179 Acute kidney failure, unspecified: Secondary | ICD-10-CM | POA: Diagnosis present

## 2016-08-04 DIAGNOSIS — E871 Hypo-osmolality and hyponatremia: Secondary | ICD-10-CM | POA: Insufficient documentation

## 2016-08-04 DIAGNOSIS — A419 Sepsis, unspecified organism: Secondary | ICD-10-CM | POA: Diagnosis not present

## 2016-08-04 DIAGNOSIS — D649 Anemia, unspecified: Secondary | ICD-10-CM | POA: Diagnosis present

## 2016-08-04 DIAGNOSIS — R000 Tachycardia, unspecified: Secondary | ICD-10-CM | POA: Insufficient documentation

## 2016-08-04 DIAGNOSIS — I10 Essential (primary) hypertension: Secondary | ICD-10-CM | POA: Diagnosis present

## 2016-08-04 DIAGNOSIS — R1110 Vomiting, unspecified: Secondary | ICD-10-CM | POA: Diagnosis present

## 2016-08-04 LAB — CBC
HEMATOCRIT: 29.8 % — AB (ref 34.0–46.6)
Hemoglobin: 10.3 g/dL — ABNORMAL LOW (ref 11.1–15.9)
MCH: 26.3 pg — AB (ref 26.6–33.0)
MCHC: 34.6 g/dL (ref 31.5–35.7)
MCV: 76 fL — ABNORMAL LOW (ref 79–97)
PLATELETS: 338 10*3/uL (ref 150–379)
RBC: 3.92 x10E6/uL (ref 3.77–5.28)
RDW: 14.5 % (ref 12.3–15.4)
WBC: 11.1 10*3/uL — ABNORMAL HIGH (ref 3.4–10.8)

## 2016-08-04 LAB — COMPREHENSIVE METABOLIC PANEL
A/G RATIO: 1.3 (ref 1.2–2.2)
ALK PHOS: 103 IU/L (ref 39–117)
ALT: 11 IU/L (ref 0–32)
ALT: 19 U/L (ref 14–54)
ANION GAP: 12 (ref 5–15)
AST: 26 IU/L (ref 0–40)
AST: 37 U/L (ref 15–41)
Albumin: 3.5 g/dL (ref 3.2–4.6)
Albumin: 2.8 g/dL — ABNORMAL LOW (ref 3.5–5.0)
Alkaline Phosphatase: 105 U/L (ref 38–126)
BILIRUBIN TOTAL: 0.5 mg/dL (ref 0.0–1.2)
BUN/Creatinine Ratio: 20 (ref 12–28)
BUN: 39 mg/dL — ABNORMAL HIGH (ref 10–36)
BUN: 40 mg/dL — ABNORMAL HIGH (ref 6–20)
CHLORIDE: 92 mmol/L — AB (ref 96–106)
CO2: 25 mmol/L (ref 18–29)
CO2: 24 mmol/L (ref 22–32)
CREATININE: 2.09 mg/dL — AB (ref 0.44–1.00)
Calcium: 9 mg/dL (ref 8.7–10.3)
Calcium: 8.6 mg/dL — ABNORMAL LOW (ref 8.9–10.3)
Chloride: 90 mmol/L — ABNORMAL LOW (ref 101–111)
Creatinine, Ser: 1.96 mg/dL — ABNORMAL HIGH (ref 0.57–1.00)
GFR calc Af Amer: 25 mL/min/{1.73_m2} — ABNORMAL LOW (ref >59)
GFR, EST AFRICAN AMERICAN: 22 mL/min — AB (ref >60)
GFR, EST NON AFRICAN AMERICAN: 22 mL/min/{1.73_m2} — AB (ref >59)
GFR, EST NON AFRICAN AMERICAN: 19 mL/min — AB (ref >60)
Globulin, Total: 2.8 g/dL (ref 1.5–4.5)
Glucose, Bld: 129 mg/dL — ABNORMAL HIGH (ref 65–99)
Glucose: 162 mg/dL — ABNORMAL HIGH (ref 65–99)
POTASSIUM: 3.7 mmol/L (ref 3.5–5.2)
Potassium: 3.3 mmol/L — ABNORMAL LOW (ref 3.5–5.1)
Sodium: 131 mmol/L — ABNORMAL LOW (ref 134–144)
Sodium: 126 mmol/L — ABNORMAL LOW (ref 135–145)
Total Bilirubin: 0.8 mg/dL (ref 0.3–1.2)
Total Protein: 6.3 g/dL (ref 6.0–8.5)
Total Protein: 6.6 g/dL (ref 6.5–8.1)

## 2016-08-04 LAB — URINALYSIS, ROUTINE W REFLEX MICROSCOPIC
BILIRUBIN URINE: NEGATIVE
GLUCOSE, UA: NEGATIVE mg/dL
KETONES UR: NEGATIVE mg/dL
NITRITE: POSITIVE — AB
Protein, ur: 100 mg/dL — AB
Specific Gravity, Urine: 1.008 (ref 1.005–1.030)
pH: 6 (ref 5.0–8.0)

## 2016-08-04 LAB — SODIUM, URINE, RANDOM: SODIUM UR: 36 mmol/L

## 2016-08-04 LAB — CBC WITH DIFFERENTIAL/PLATELET
BASOS PCT: 0 %
Basophils Absolute: 0 10*3/uL (ref 0.0–0.1)
EOS ABS: 0.3 10*3/uL (ref 0.0–0.7)
Eosinophils Relative: 3 %
HCT: 30.2 % — ABNORMAL LOW (ref 36.0–46.0)
HEMOGLOBIN: 9.9 g/dL — AB (ref 12.0–15.0)
LYMPHS ABS: 1.6 10*3/uL (ref 0.7–4.0)
Lymphocytes Relative: 12 %
MCH: 25.6 pg — AB (ref 26.0–34.0)
MCHC: 32.8 g/dL (ref 30.0–36.0)
MCV: 78.2 fL (ref 78.0–100.0)
Monocytes Absolute: 1.2 10*3/uL — ABNORMAL HIGH (ref 0.1–1.0)
Monocytes Relative: 9 %
NEUTROS ABS: 10.1 10*3/uL — AB (ref 1.7–7.7)
NEUTROS PCT: 76 %
Platelets: 315 10*3/uL (ref 150–400)
RBC: 3.86 MIL/uL — AB (ref 3.87–5.11)
RDW: 14.2 % (ref 11.5–15.5)
WBC: 13.3 10*3/uL — AB (ref 4.0–10.5)

## 2016-08-04 LAB — BRAIN NATRIURETIC PEPTIDE: BNP: 492.9 pg/mL — ABNORMAL HIGH (ref 0.0–100.0)

## 2016-08-04 LAB — LIPASE: LIPASE: 17 U/L (ref 14–85)

## 2016-08-04 LAB — CREATININE, URINE, RANDOM: Creatinine, Urine: 44.23 mg/dL

## 2016-08-04 MED ORDER — ENOXAPARIN SODIUM 30 MG/0.3ML ~~LOC~~ SOLN
30.0000 mg | SUBCUTANEOUS | Status: DC
  Administered 2016-08-05: 30 mg via SUBCUTANEOUS
  Filled 2016-08-04: qty 0.3

## 2016-08-04 MED ORDER — ACETAMINOPHEN 650 MG RE SUPP: 650.0000 mg | Freq: Four times a day (QID) | RECTAL | Status: DC | PRN

## 2016-08-04 MED ORDER — ONDANSETRON HCL 4 MG/2ML IJ SOLN: 4.0000 mg | Freq: Four times a day (QID) | INTRAMUSCULAR | Status: DC | PRN

## 2016-08-04 MED ORDER — ACETAMINOPHEN 325 MG PO TABS: 650.0000 mg | ORAL_TABLET | Freq: Four times a day (QID) | ORAL | Status: DC | PRN

## 2016-08-04 MED ORDER — SODIUM CHLORIDE 0.9 % IV BOLUS (SEPSIS)
1000.0000 mL | Freq: Once | INTRAVENOUS | Status: AC
  Administered 2016-08-04: 1000 mL via INTRAVENOUS

## 2016-08-04 MED ORDER — ASPIRIN EC 81 MG PO TBEC
81.0000 mg | DELAYED_RELEASE_TABLET | Freq: Every day | ORAL | Status: DC
Start: 2016-08-05 — End: 2016-08-06
  Administered 2016-08-05 – 2016-08-06 (×2): 81 mg via ORAL
  Filled 2016-08-04 (×2): qty 1

## 2016-08-04 MED ORDER — ONDANSETRON HCL 4 MG PO TABS: 4.0000 mg | ORAL_TABLET | Freq: Four times a day (QID) | ORAL | Status: DC | PRN

## 2016-08-04 MED ORDER — DIPHENHYDRAMINE HCL 25 MG PO CAPS: 50.0000 mg | ORAL_CAPSULE | Freq: Every evening | ORAL | Status: DC | PRN

## 2016-08-04 MED ORDER — AMLODIPINE BESYLATE 10 MG PO TABS
10.0000 mg | ORAL_TABLET | Freq: Every day | ORAL | Status: DC
  Administered 2016-08-05 – 2016-08-06 (×2): 10 mg via ORAL
  Filled 2016-08-04 (×2): qty 1

## 2016-08-04 MED ORDER — DEXTROSE 5 % IV SOLN
1.0000 g | Freq: Once | INTRAVENOUS | Status: AC
  Administered 2016-08-05: 1 g via INTRAVENOUS
  Filled 2016-08-04: qty 10

## 2016-08-04 MED ORDER — SODIUM CHLORIDE 0.9 % IV SOLN
INTRAVENOUS | Status: AC
  Administered 2016-08-05 (×2): via INTRAVENOUS

## 2016-08-04 MED ORDER — METOPROLOL SUCCINATE ER 25 MG PO TB24
25.0000 mg | ORAL_TABLET | Freq: Every day | ORAL | Status: DC
  Administered 2016-08-05 – 2016-08-06 (×2): 25 mg via ORAL
  Filled 2016-08-04 (×2): qty 1

## 2016-08-04 MED ORDER — ADULT MULTIVITAMIN W/MINERALS CH
1.0000 | ORAL_TABLET | Freq: Every day | ORAL | Status: DC
  Administered 2016-08-05 – 2016-08-06 (×2): 1 via ORAL
  Filled 2016-08-04 (×2): qty 1

## 2016-08-04 NOTE — Addendum Note (Signed)
Addended by: Alyson InglesBROOME, MICHELLE L on: 08/04/2016 09:45 AM   Modules accepted: Orders

## 2016-08-04 NOTE — H&P (Signed)
History and Physical    Gloria Odom ZOX:[id number] DOB: 04-23-1920 DOA: 08/04/2016  PCP: Patient, No Pcp Per  Patient coming from: Home.  Chief Complaint: Abnormal labs.  HPI: Gloria Odom is a 83 y.o. female with history of hypertension, diastolic dysfunction and aortic stenosis was advised to come to the ER by patient's cardiologist as patient's labs showed acute renal failure. Patient had followed up with cardiologist 2 days ago for routine check when patient was found to be tachycardic. 3 days ago patient had nausea and vomiting one episode following which patient has been anorexic and weak and fatigued. Patient also has been found to be tachycardic despite taking her metoprolol. Lab works was done in the office and showed patient's creatinine has increased from baseline of 1-1.9 and advised to come to the ER. Patient denies any chest pain shortness of breath. Only had one episode of nausea and vomiting following which patient has benign for appetite. Denies any diarrhea.  ED Course: Lab works in the ER show a creatinine of 2 with anemia. Anemia has been chronic. Patient's EKG shows normal sinus rhythm with nonspecific ST changes and RBBB. Patient was given 1 L fluid bolus and admitted for further observation.  Review of Systems: As per HPI, rest all negative.   Past Medical History:  Diagnosis Date  . Bifascicular block   . Diastolic dysfunction   . Dyslipidemia   . Hypertension   . LVH (left ventricular hypertrophy)     Past Surgical History:  Procedure Laterality Date  . TRANSTHORACIC ECHOCARDIOGRAM  2007   EF normal; mild MR & TR; mild AV regurg & AV moderately sclerotic     reports that she has quit smoking. She has never used smokeless tobacco. She reports that she does not drink alcohol or use drugs.  No Known Allergies  Family History  Problem Relation Age of Onset  . Diabetes Mother   . Kidney failure Mother   . Emphysema Father   . Stroke Maternal  Grandmother   . Stroke Maternal Grandfather   . Emphysema Brother   . Asthma Sister   . Cancer Sister   . Hyperlipidemia Sister   . Heart attack Child     Prior to Admission medications   Medication Sig Start Date End Date Taking? Authorizing Provider  amLODipine (NORVASC) 10 MG tablet TAKE 1 TABLET EVERY DAY Patient taking differently: Take 10 mg by mouth once a day 07/25/16  Yes Hilty, Lisette Abu, MD  aspirin EC 81 MG tablet Take 81 mg by mouth daily.   Yes [provider]  diphenhydrAMINE (BENADRYL) 25 mg capsule Take 50 mg by mouth at bedtime as needed for itching or allergies.    Yes [provider]  metoprolol succinate (TOPROL-XL) 25 MG 24 hr tablet TAKE 1 TABLET BY MOUTH EVERY DAY. Patient taking differently: Take 25 mg by mouth once a day 08/17/15  Yes Hilty, Lisette Abu, MD  Multiple Vitamins-Minerals (CENTRUM SILVER ADULT 50+) TABS Take 1 tablet by mouth daily.   Yes [provider]  naproxen sodium (ANAPROX) 220 MG tablet Take 440 mg by mouth daily as needed (for pain).   Yes [provider]  valsartan-hydrochlorothiazide (DIOVAN-HCT) 320-25 MG tablet TAKE 1 TABLET EVERY DAY Patient taking differently: Take 1 tablet by mouth once a day 07/25/16  Yes Chrystie Nose, MD    Physical Exam: Vitals:   08/04/16 2115 08/04/16 2130 08/04/16 2145 08/04/16 2200  BP: 123/66 120/82 120/84 103/83  Pulse: 82  82 82 (!) 129  Resp: (!) 22 19 (!) 23 (!) 29  Temp:      TempSrc:      SpO2: 97% 100% 98% 94%      Constitutional: Moderately built and nourished. Vitals:   08/04/16 2115 08/04/16 2130 08/04/16 2145 08/04/16 2200  BP: 123/66 120/82 120/84 103/83  Pulse: 82 82 82 (!) 129  Resp: (!) 22 19 (!) 23 (!) 29  Temp:      TempSrc:      SpO2: 97% 100% 98% 94%   Eyes: Anicteric no pallor. ENMT: No discharge from the ears eyes nose and mouth. Neck: No mass felt. No neck rigidity. No JVD appreciated. Respiratory: No rhonchi or  crepitations. Cardiovascular: S1-S2 heard. Systolic murmur. Abdomen: Soft nontender bowel sounds present. Musculoskeletal: No edema. No joint effusion. Skin: No rash. Skin appears warm. Neurologic: Alert awake oriented to time place and person. Does all extremities. Psychiatric: Appears normal. Normal affect.   Labs on Admission: I have personally reviewed following labs and imaging studies  CBC:  Recent Labs Lab 08/03/16 1651 08/04/16 1749  WBC 11.1* 13.3*  NEUTROABS  --  10.1*  HGB  --  9.9*  HCT 29.8* 30.2*  MCV 76* 78.2  PLT 338 315   Basic Metabolic Panel:  Recent Labs Lab 08/03/16 1651 08/04/16 1749  NA 131* 126*  K 3.7 3.3*  CL 92* 90*  CO2 25 24  GLUCOSE 162* 129*  BUN 39* 40*  CREATININE 1.96* 2.09*  CALCIUM 9.0 8.6*   GFR: CrCl cannot be calculated (Unknown ideal weight.). Liver Function Tests:  Recent Labs Lab 08/03/16 1651 08/04/16 1749  AST 26 37  ALT 11 19  ALKPHOS 103 105  BILITOT 0.5 0.8  PROT 6.3 6.6  ALBUMIN 3.5 2.8*    Recent Labs Lab 08/03/16 1651  LIPASE 17   No results for input(s): AMMONIA in the last 168 hours. Coagulation Profile: No results for input(s): INR, PROTIME in the last 168 hours. Cardiac Enzymes: No results for input(s): CKTOTAL, CKMB, CKMBINDEX, TROPONINI in the last 168 hours. BNP (last 3 results) No results for input(s): PROBNP in the last 8760 hours. HbA1C: No results for input(s): HGBA1C in the last 72 hours. CBG: No results for input(s): GLUCAP in the last 168 hours. Lipid Profile: No results for input(s): CHOL, HDL, LDLCALC, TRIG, CHOLHDL, LDLDIRECT in the last 72 hours. Thyroid Function Tests: No results for input(s): TSH, T4TOTAL, FREET4, T3FREE, THYROIDAB in the last 72 hours. Anemia Panel: No results for input(s): VITAMINB12, FOLATE, FERRITIN, TIBC, IRON, RETICCTPCT in the last 72 hours. Urine analysis:    Component Value Date/Time   COLORURINE YELLOW 08/18/2015 1352   APPEARANCEUR CLEAR  08/18/2015 1352   LABSPEC 1.015 08/18/2015 1352   PHURINE 6.0 08/18/2015 1352   GLUCOSEU NEGATIVE 08/18/2015 1352   HGBUR SMALL (A) 08/18/2015 1352   BILIRUBINUR NEGATIVE 08/18/2015 1352   KETONESUR NEGATIVE 08/18/2015 1352   PROTEINUR NEGATIVE 08/18/2015 1352   NITRITE NEGATIVE 08/18/2015 1352   LEUKOCYTESUR NEGATIVE 08/18/2015 1352   Sepsis Labs: @LABRCNTIP (procalcitonin:4,lacticidven:4) )No results found for this or any previous visit (from the past 240 hour(s)).   Radiological Exams on Admission: Dg Chest 2 View  Result Date: 08/04/2016 CLINICAL DATA:  Fatigue and vomiting EXAM: CHEST  2 VIEW COMPARISON:  None. FINDINGS: Hyperinflation. No focal infiltrate or effusion. Borderline cardiomegaly with atherosclerosis. No pneumothorax. Advanced degenerative changes of the bilateral shoulders. IMPRESSION: Hyperinflation without acute infiltrate or edema. Electronically Signed   By: Selena BattenKim  Jake Samples M.D.   On: 08/04/2016 20:48    EKG: Independently reviewed. Normal sinus rhythm with RBBB.  Assessment/Plan Active Problems:   HTN (hypertension)   Aortic insufficiency with aortic stenosis   Vomiting   Acute renal failure (ARF) (HCC)   Normochromic normocytic anemia    1. Acute renal failure - probably prerenal from poor oral intake. UA shows possible UTI which could be controlled bleeding to decrease oral intake. Gently hydrate hold ARB and diuretic. Follow intake and output and metabolic panel. Since patient also has anemia check SPEP. 2. Possible UTI - check urine culture. Patient is on ceftriaxone. 3. Tachycardia - will check thyroid function tests. Closely monitor in telemetry. 4. Aortic stenosis - please review cardiology notes from yesterday. Check 2-D echo. 5. Hypertension - continue metoprolol and amlodipine. Holding ARB and diuretics due to acute renal failure. 6. Normocytic normochromic anemia - appears to be chronic. Check anemia panel and SPEP due to renal failure.   DVT  prophylaxis: Lovenox. Code Status: Full code.  Family Communication: Patient's daughter.  Disposition Plan: Home.  Consults called: None.  Admission status: Observation.    Eduard Clos MD [facility] Pager [phone].  If 7PM-7AM, please contact night-coverage [email or web address] Password [facility]  08/04/2016, 10:56 PM

## 2016-08-04 NOTE — ED Provider Notes (Signed)
[facility]-EMERGENCY DEPT Provider Note   CSN: [id number] Arrival date & time: 08/04/16  1707     History   Chief Complaint Chief Complaint  Patient presents with  . Abnormal Lab    HPI Gloria Odom is a 83 y.o. female.  Patient with nausea and vomiting x 3 several days ago after eating [employer] and since has had generalized weakness and fatigue. No chest pain, abdominal pain, urinary symptoms otherwise. Patient saw cardiologist yesterday at regular check up and ordered basic labs and patient found to have elevated creatine. Patient sent for evaluation.    The history is provided by the patient.  Illness  This is a new problem. The current episode started more than 2 days ago. The problem occurs daily. The problem has been gradually worsening. Pertinent negatives include no chest pain, no abdominal pain, no headaches and no shortness of breath. Nothing aggravates the symptoms. Nothing relieves the symptoms.    Past Medical History:  Diagnosis Date  . Bifascicular block   . Diastolic dysfunction   . Dyslipidemia   . Hypertension   . LVH (left ventricular hypertrophy)     Patient Active Problem List   Diagnosis Date Noted  . Acute renal failure (ARF) (HCC) 08/04/2016  . Normochromic normocytic anemia 08/04/2016  . Vomiting 08/03/2016  . Loss of appetite 08/03/2016  . Chills 08/03/2016  . Aortic insufficiency with aortic stenosis 06/16/2015  . Physical deconditioning 05/10/2015  . HTN (hypertension) 06/12/2013  . Dyslipidemia 06/12/2013  . PAC (premature atrial contraction) 06/12/2013    Past Surgical History:  Procedure Laterality Date  . TRANSTHORACIC ECHOCARDIOGRAM  2007   EF normal; mild MR & TR; mild AV regurg & AV moderately sclerotic    OB History    Gravida Para Term Preterm AB Living   2 2 2     2    SAB TAB Ectopic Multiple Live Births                   Home Medications    Prior to Admission medications   Medication Sig Start Date End Date Taking?  Authorizing Provider  amLODipine (NORVASC) 10 MG tablet TAKE 1 TABLET EVERY DAY Patient taking differently: Take 10 mg by mouth once a day 07/25/16  Yes Hilty, Lisette AbuKenneth C, MD  aspirin EC 81 MG tablet Take 81 mg by mouth daily.   Yes [provider]  diphenhydrAMINE (BENADRYL) 25 mg capsule Take 50 mg by mouth at bedtime as needed for itching or allergies.    Yes [provider]  metoprolol succinate (TOPROL-XL) 25 MG 24 hr tablet TAKE 1 TABLET BY MOUTH EVERY DAY. Patient taking differently: Take 25 mg by mouth once a day 08/17/15  Yes Hilty, Lisette AbuKenneth C, MD  Multiple Vitamins-Minerals (CENTRUM SILVER ADULT 50+) TABS Take 1 tablet by mouth daily.   Yes [provider]  naproxen sodium (ANAPROX) 220 MG tablet Take 440 mg by mouth daily as needed (for pain).   Yes [provider]  valsartan-hydrochlorothiazide (DIOVAN-HCT) 320-25 MG tablet TAKE 1 TABLET EVERY DAY Patient taking differently: Take 1 tablet by mouth once a day 07/25/16  Yes Hilty, Lisette AbuKenneth C, MD    Family History Family History  Problem Relation Age of Onset  . Diabetes Mother   . Kidney failure Mother   . Emphysema Father   . Stroke Maternal Grandmother   . Stroke Maternal Grandfather   . Emphysema Brother   . Asthma Sister   . Cancer Sister   .  Hyperlipidemia Sister   . Heart attack Child     Social History Social History  Substance Use Topics  . Smoking status: Former Games developer  . Smokeless tobacco: Never Used  . Alcohol use No     Allergies   Patient has no known allergies.   Review of Systems Review of Systems  Constitutional: Negative for chills and fever.  HENT: Negative for ear pain and sore throat.   Eyes: Negative for pain and visual disturbance.  Respiratory: Negative for cough and shortness of breath.   Cardiovascular: Negative for chest pain and palpitations.  Gastrointestinal: Positive for nausea and vomiting. Negative for abdominal pain.  Genitourinary: Negative for  decreased urine volume, dysuria, hematuria and urgency.  Musculoskeletal: Negative for arthralgias and back pain.  Skin: Negative for color change and rash.  Neurological: Negative for seizures, syncope and headaches.  All other systems reviewed and are negative.    Physical Exam Updated Vital Signs  ED Triage Vitals [08/04/16 1740]  Enc Vitals Group     BP 119/61     Pulse Rate 87     Resp 16     Temp 98.7 F (37.1 C)     Temp Source Oral     SpO2 100 %     Weight      Height      Head Circumference      Peak Flow      Pain Score      Pain Loc      Pain Edu?      Excl. in GC?     Physical Exam  Constitutional: She appears well-developed and well-nourished. No distress.  HENT:  Head: Normocephalic and atraumatic.  Eyes: Conjunctivae are normal. Pupils are equal, round, and reactive to light.  Neck: Normal range of motion. Neck supple.  Cardiovascular: Normal rate, regular rhythm, normal heart sounds and intact distal pulses.   No murmur heard. Pulmonary/Chest: Effort normal and breath sounds normal. No respiratory distress.  Abdominal: Soft. Bowel sounds are normal. She exhibits no distension. There is no tenderness.  Musculoskeletal: Normal range of motion. She exhibits no edema.  Neurological: She is alert.  Skin: Skin is warm and dry.  Psychiatric: She has a normal mood and affect.  Nursing note and vitals reviewed.    ED Treatments / Results  Labs (all labs ordered are listed, but only abnormal results are displayed) Labs Reviewed  COMPREHENSIVE METABOLIC PANEL - Abnormal; Notable for the following:       Result Value   Sodium 126 (*)    Potassium 3.3 (*)    Chloride 90 (*)    Glucose, Bld 129 (*)    BUN 40 (*)    Creatinine, Ser 2.09 (*)    Calcium 8.6 (*)    Albumin 2.8 (*)    GFR calc non Af Amer 19 (*)    GFR calc Af Amer 22 (*)    All other components within normal limits  CBC WITH DIFFERENTIAL/PLATELET - Abnormal; Notable for the following:      WBC 13.3 (*)    RBC 3.86 (*)    Hemoglobin 9.9 (*)    HCT 30.2 (*)    MCH 25.6 (*)    Neutro Abs 10.1 (*)    Monocytes Absolute 1.2 (*)    All other components within normal limits  URINALYSIS, ROUTINE W REFLEX MICROSCOPIC - Abnormal; Notable for the following:    Color, Urine AMBER (*)    APPearance TURBID (*)  Hgb urine dipstick LARGE (*)    Protein, ur 100 (*)    Nitrite POSITIVE (*)    Leukocytes, UA LARGE (*)    Bacteria, UA MANY (*)    Squamous Epithelial / LPF 0-5 (*)    Non Squamous Epithelial 0-5 (*)    All other components within normal limits  URINE CULTURE  SODIUM, URINE, RANDOM  CREATININE, URINE, RANDOM  TROPONIN I  LACTIC ACID, PLASMA  BASIC METABOLIC PANEL  CBC  CBC  CREATININE, SERUM  HEPATIC FUNCTION PANEL  TSH  MAGNESIUM    EKG  EKG Interpretation  Date/Time:  Thursday Aug 04 2016 21:05:46 EDT Ventricular Rate:  82 PR Interval:    QRS Duration: 124 QT Interval:  391 QTC Calculation: 457 R Axis:   -57 Text Interpretation:  Sinus arrhythmia RBBB and LAFB Nonspecific T abnormalities, lateral leads Minimal ST elevation, lateral leads no prior EKG  Confirmed by Crista Curb [phone]) on 08/04/2016 9:58:45 PM       Radiology Dg Chest 2 View  Result Date: 08/04/2016 CLINICAL DATA:  Fatigue and vomiting EXAM: CHEST  2 VIEW COMPARISON:  None. FINDINGS: Hyperinflation. No focal infiltrate or effusion. Borderline cardiomegaly with atherosclerosis. No pneumothorax. Advanced degenerative changes of the bilateral shoulders. IMPRESSION: Hyperinflation without acute infiltrate or edema. Electronically Signed   By: Jasmine Pang M.D.   On: 08/04/2016 20:48    Procedures Procedures (including critical care time)  Medications Ordered in ED Medications  amLODipine (NORVASC) tablet 10 mg (not administered)  aspirin EC tablet 81 mg (not administered)  diphenhydrAMINE (BENADRYL) capsule 50 mg (not administered)  multivitamin with minerals tablet 1 tablet (not  administered)  metoprolol succinate (TOPROL-XL) 24 hr tablet 25 mg (not administered)  acetaminophen (TYLENOL) tablet 650 mg (not administered)    Or  acetaminophen (TYLENOL) suppository 650 mg (not administered)  ondansetron (ZOFRAN) tablet 4 mg (not administered)    Or  ondansetron (ZOFRAN) injection 4 mg (not administered)  enoxaparin (LOVENOX) injection 30 mg (not administered)  0.9 %  sodium chloride infusion (not administered)  cefTRIAXone (ROCEPHIN) 1 g in dextrose 5 % 50 mL IVPB (not administered)  sodium chloride 0.9 % bolus 1,000 mL (0 mLs Intravenous Stopped 08/04/16 2245)     Initial Impression / Assessment and Plan / ED Course  I have reviewed the triage vital signs and the nursing notes.  Pertinent labs & imaging results that were available during my care of the patient were reviewed by me and considered in my medical decision making (see chart for details).     Gloria Odom is a 83 year old female with history of hypertension, heart failure who presents to the ED with acute kidney injury. Patient's vitals at time of arrival to the ED are unremarkable and patient is without fever. Patient saw her cardiologist yesterday for routine checkup and had basic labs drawn as she has had fatigue and generalized weakness for the last several days. Patient states 4 days ago she ate [employer] and had vomiting and nausea. Patient states she has not been able to eat or drink much since then. She denies chest pain, belly pain, urinary symptoms. Patient with overall unremarkable exam. Patient does appear dehydrated as she does have dry mucous membranes. Otherwise we'll get labs to evaluate including chest x-ray, urinalysis. Patient given normal saline bolus.  Creatinine elevated from baseline. Patient also with slight hyponatremia. Elevation likely secondary to dehydration and poor by mouth intake. Labs otherwise unremarkable with no significant anemia. Patient admitted to hospitalist  service  for further workup and hydration. Chest x-ray showed no signs of pneumonia, pneumothorax, pleural effusion. Urinalysis resulted after patient transferred to the floor and she was found to have urinary tract infection with positive nitrates and leukocytes and many bacteria urinalysis. I ordered IV Rocephin and urine culture. Patient was hemodynamically stable at time of transfer and no signs to suggest severe sepsis.  Final Clinical Impressions(s) / ED Diagnoses   Final diagnoses:  Acute cystitis without hematuria  AKI (acute kidney injury) [facility])    New Prescriptions Current Discharge Medication List       Virgina Norfolk, DO 08/05/16 0005    Lavera Guise, MD 08/05/16 0020

## 2016-08-04 NOTE — ED Triage Notes (Signed)
Pt presents for evaluation of abnormal labs per dr. Rennis GoldenHilty. States lab work done yesterday on routine checkup but has been fatigued and had period of vomiting on Sunday. Reports they were contacted by PCP to come for evaluation of dehydration.

## 2016-08-05 ENCOUNTER — Observation Stay (HOSPITAL_BASED_OUTPATIENT_CLINIC_OR_DEPARTMENT_OTHER): Payer: Medicare Other

## 2016-08-05 DIAGNOSIS — I352 Nonrheumatic aortic (valve) stenosis with insufficiency: Secondary | ICD-10-CM | POA: Diagnosis not present

## 2016-08-05 DIAGNOSIS — A419 Sepsis, unspecified organism: Secondary | ICD-10-CM

## 2016-08-05 DIAGNOSIS — I10 Essential (primary) hypertension: Secondary | ICD-10-CM

## 2016-08-05 DIAGNOSIS — I350 Nonrheumatic aortic (valve) stenosis: Secondary | ICD-10-CM

## 2016-08-05 DIAGNOSIS — R1110 Vomiting, unspecified: Secondary | ICD-10-CM | POA: Diagnosis not present

## 2016-08-05 DIAGNOSIS — R748 Abnormal levels of other serum enzymes: Secondary | ICD-10-CM | POA: Diagnosis not present

## 2016-08-05 DIAGNOSIS — E785 Hyperlipidemia, unspecified: Secondary | ICD-10-CM | POA: Diagnosis not present

## 2016-08-05 DIAGNOSIS — N179 Acute kidney failure, unspecified: Secondary | ICD-10-CM

## 2016-08-05 DIAGNOSIS — N390 Urinary tract infection, site not specified: Secondary | ICD-10-CM

## 2016-08-05 DIAGNOSIS — N183 Chronic kidney disease, stage 3 (moderate): Secondary | ICD-10-CM

## 2016-08-05 LAB — CBC
HCT: 26.5 % — ABNORMAL LOW (ref 36.0–46.0)
HEMATOCRIT: 29.1 % — AB (ref 36.0–46.0)
HEMOGLOBIN: 9.5 g/dL — AB (ref 12.0–15.0)
Hemoglobin: 8.8 g/dL — ABNORMAL LOW (ref 12.0–15.0)
MCH: 25.3 pg — ABNORMAL LOW (ref 26.0–34.0)
MCH: 25.6 pg — ABNORMAL LOW (ref 26.0–34.0)
MCHC: 32.6 g/dL (ref 30.0–36.0)
MCHC: 33.2 g/dL (ref 30.0–36.0)
MCV: 77.4 fL — ABNORMAL LOW (ref 78.0–100.0)
MCV: 77 fL — ABNORMAL LOW (ref 78.0–100.0)
PLATELETS: 274 10*3/uL (ref 150–400)
Platelets: 310 10*3/uL (ref 150–400)
RBC: 3.44 MIL/uL — AB (ref 3.87–5.11)
RBC: 3.76 MIL/uL — AB (ref 3.87–5.11)
RDW: 14 % (ref 11.5–15.5)
RDW: 14.1 % (ref 11.5–15.5)
WBC: 13.1 10*3/uL — ABNORMAL HIGH (ref 4.0–10.5)
WBC: 13.4 10*3/uL — ABNORMAL HIGH (ref 4.0–10.5)

## 2016-08-05 LAB — T4, FREE: FREE T4: 1.6 ng/dL — AB (ref 0.61–1.12)

## 2016-08-05 LAB — TROPONIN I
TROPONIN I: 0.43 ng/mL — AB (ref <0.03)
Troponin I: 0.12 ng/mL (ref <0.03)
Troponin I: 0.05 ng/mL (ref <0.03)
Troponin I: 0.03 ng/mL (ref <0.03)

## 2016-08-05 LAB — BASIC METABOLIC PANEL
Anion gap: 9 (ref 5–15)
BUN: 37 mg/dL — ABNORMAL HIGH (ref 6–20)
CO2: 25 mmol/L (ref 22–32)
CREATININE: 1.77 mg/dL — AB (ref 0.44–1.00)
Calcium: 8.2 mg/dL — ABNORMAL LOW (ref 8.9–10.3)
Chloride: 98 mmol/L — ABNORMAL LOW (ref 101–111)
GFR, EST AFRICAN AMERICAN: 27 mL/min — AB (ref >60)
GFR, EST NON AFRICAN AMERICAN: 24 mL/min — AB (ref >60)
Glucose, Bld: 103 mg/dL — ABNORMAL HIGH (ref 65–99)
POTASSIUM: 3.2 mmol/L — AB (ref 3.5–5.1)
SODIUM: 132 mmol/L — AB (ref 135–145)

## 2016-08-05 LAB — IRON AND TIBC
Iron: 11 ug/dL — ABNORMAL LOW (ref 28–170)
Saturation Ratios: 4 % — ABNORMAL LOW (ref 10.4–31.8)
TIBC: 284 ug/dL (ref 250–450)
UIBC: 273 ug/dL

## 2016-08-05 LAB — RETICULOCYTES
RBC.: 3.61 MIL/uL — AB (ref 3.87–5.11)
RETIC COUNT ABSOLUTE: 14.4 10*3/uL — AB (ref 19.0–186.0)
RETIC CT PCT: 0.4 % (ref 0.4–3.1)

## 2016-08-05 LAB — HEPATIC FUNCTION PANEL
ALBUMIN: 2.2 g/dL — AB (ref 3.5–5.0)
ALT: 17 U/L (ref 14–54)
AST: 25 U/L (ref 15–41)
Alkaline Phosphatase: 90 U/L (ref 38–126)
Bilirubin, Direct: 0.2 mg/dL (ref 0.1–0.5)
Indirect Bilirubin: 0.5 mg/dL (ref 0.3–0.9)
TOTAL PROTEIN: 5.4 g/dL — AB (ref 6.5–8.1)
Total Bilirubin: 0.7 mg/dL (ref 0.3–1.2)

## 2016-08-05 LAB — FERRITIN: FERRITIN: 111 ng/mL (ref 11–307)

## 2016-08-05 LAB — FOLATE: Folate: 7.8 ng/mL (ref >5.9)

## 2016-08-05 LAB — ECHOCARDIOGRAM COMPLETE: HEIGHTINCHES: 66 in

## 2016-08-05 LAB — LACTIC ACID, PLASMA: LACTIC ACID, VENOUS: 1 mmol/L (ref 0.5–1.9)

## 2016-08-05 LAB — MAGNESIUM: MAGNESIUM: 1.8 mg/dL (ref 1.7–2.4)

## 2016-08-05 LAB — CREATININE, SERUM
CREATININE: 1.91 mg/dL — AB (ref 0.44–1.00)
GFR, EST AFRICAN AMERICAN: 25 mL/min — AB (ref >60)
GFR, EST NON AFRICAN AMERICAN: 22 mL/min — AB (ref >60)

## 2016-08-05 LAB — TSH: TSH: 0.128 u[IU]/mL — AB (ref 0.350–4.500)

## 2016-08-05 LAB — VITAMIN B12: Vitamin B-12: 577 pg/mL (ref 180–914)

## 2016-08-05 MED ORDER — ENSURE ENLIVE PO LIQD
237.0000 mL | Freq: Two times a day (BID) | ORAL | Status: DC
  Administered 2016-08-05: 237 mL via ORAL

## 2016-08-05 MED ORDER — HEPARIN (PORCINE) IN NACL 100-0.45 UNIT/ML-% IJ SOLN
800.0000 [IU]/h | INTRAMUSCULAR | Status: DC
  Administered 2016-08-05: 800 [IU]/h via INTRAVENOUS
  Filled 2016-08-05: qty 250

## 2016-08-05 MED ORDER — POTASSIUM CHLORIDE CRYS ER 20 MEQ PO TBCR
40.0000 meq | EXTENDED_RELEASE_TABLET | Freq: Once | ORAL | Status: AC
  Administered 2016-08-05: 40 meq via ORAL
  Filled 2016-08-05: qty 2

## 2016-08-05 MED ORDER — HEPARIN BOLUS VIA INFUSION
2000.0000 [IU] | Freq: Once | INTRAVENOUS | Status: AC
  Administered 2016-08-05: 2000 [IU] via INTRAVENOUS
  Filled 2016-08-05: qty 2000

## 2016-08-05 MED ORDER — DEXTROSE 5 % IV SOLN
1.0000 g | INTRAVENOUS | Status: DC
  Administered 2016-08-05: 1 g via INTRAVENOUS
  Filled 2016-08-05: qty 10

## 2016-08-05 NOTE — ED Provider Notes (Signed)
I saw and evaluated the patient, reviewed the resident's note and I agree with the findings and plan.   EKG Interpretation  Date/Time:  Thursday Aug 04 2016 21:05:46 EDT Ventricular Rate:  82 PR Interval:    QRS Duration: 124 QT Interval:  391 QTC Calculation: 457 R Axis:   -57 Text Interpretation:  Sinus arrhythmia RBBB and LAFB Nonspecific T abnormalities, lateral leads Minimal ST elevation, lateral leads no prior EKG  Confirmed by Crista CurbLiu, Amauri Keefe [phone]) on 08/04/2016 9:58:5945 PM      83 year old female who presents with abnormal blood work. History of aortic stenosis. Had one day of nausea and vomiting 4 days ago that resolved after one day. Since then not eating or drinking much. With decreased appetite and generalized weakness. Unsteady of feet. Seen by cardiology one day ago and due to abnormal blood work was sent to ED.   She is non-toxic in ED. With stable vital signs. Appears dry on exam. Blood work suggestive of dehydration with hyponatremia 126 and AKI creatine of 2.09. CXR visualized and without pneumonia or other acute cardiopulmonary processes. UA suggestive of UTI and will tx with ceftriaxone. Patient admitted to hospitalist service.   Lavera GuiseLiu, Burnis Halling Duo, MD 08/05/16 [phone]

## 2016-08-05 NOTE — Progress Notes (Signed)
Pharmacy Antibiotic Note  Gloria Odom is a 83 y.o. female admitted on 08/04/2016 with UTI.  Pharmacy has been consulted for Rocephin dosing.  Plan: Rocephin 1gm IV q24h Pharmacy will sign off - please reconsult if needed  Height: 5\' 6"  (167.6 cm) IBW/kg (Calculated) : 59.3  Temp (24hrs), Avg:98.8 F (37.1 C), Min:98.7 F (37.1 C), Max:98.9 F (37.2 C)   Recent Labs Lab 08/03/16 1651 08/04/16 1749 08/05/16 0013 08/05/16 0550  WBC 11.1* 13.3* 13.1* 13.4*  CREATININE 1.96* 2.09* 1.91* 1.77*  LATICACIDVEN  --   --  1.0  --     CrCl cannot be calculated (Unknown ideal weight.).    No Known Allergies   Thank you for allowing pharmacy to be a part of this patient's care.  Lavonia Danamend, Torrez Renfroe George 08/05/2016 7:03 AM

## 2016-08-05 NOTE — Progress Notes (Signed)
MD Toniann FailKakrakandy notified of patient's troponin - 0.12.  Pt asymptomatic at this time.  RN will continue to monitor.

## 2016-08-05 NOTE — Progress Notes (Signed)
PROGRESS NOTE    Gloria EtienneLelia Logie  VHQ:[id number] DOB: [birth year] DOA: 08/04/2016 PCP: Patient, No Pcp Per   Chief Complaint  Patient presents with  . Abnormal Lab    Brief Narrative:  HPI On 08/04/2016 by Dr. Midge MiniumArshad Kakrakandy Gloria Odom is a 83 y.o. female with history of hypertension, diastolic dysfunction and aortic stenosis was advised to come to the ER by patient's cardiologist as patient's labs showed acute renal failure. Patient had followed up with cardiologist 2 days ago for routine check when patient was found to be tachycardic. 3 days ago patient had nausea and vomiting one episode following which patient has been anorexic and weak and fatigued. Patient also has been found to be tachycardic despite taking her metoprolol. Lab works was done in the office and showed patient's creatinine has increased from baseline of 1-1.9 and advised to come to the ER. Patient denies any chest pain shortness of breath. Only had one episode of nausea and vomiting following which patient has benign for appetite. Denies any diarrhea. Assessment & Plan   Acute kidney injury on chronic kidney disease, stage III -Likely secondary to dehydration, poor oral intake, prerenal causes/Sepsis -Creatinine upon admission 2.09 (baseline ? Cr 1.06 on 08/18/2015) -Given IVF, Creatinine improving 1.77 -Continue to monitor BMP  Elevated troponin/Elevated BNP -Possibly demand vs related to AKI -Troponin currently up to 0.43 -No reports of chest pain -Echocardiogram pending -Will start patient on heparin -Obtain repeat EKG (EKG on admission showed LAFB) -BNP was 492.9 prior to admission (Echocardiogram in 2007 showed normal LVEF)  Sepsis secondary to Possible UTI -Patient was tachycardic, tachypneic, with leukocytosis on admission -UA: Area, TNTC WBC, positive nitrates, large leukocytes -Urine and blood cultures pending (not done before antibiotics started) -Continue ceftriaxone  Abnormal thyroid  testing -TSH 0.128 -FT4 1.6 -Given acute illness, would repeat thyroid testing in a few weeks  Essential hypertension -Continue amlodipine, metoprolol  Microcytic anemia -hemoglobin in June 2017 was 11.8 -Currently 8.8 (was 9.9 upon admission), suspect drop secondary to IVF/dilutional component -Continue to monitor CBC -Iron 11, ferritin 111, sat ratio 4, B12: 577, folate 7.8  Aortic stenosis -Echocardiogram pending  DVT Prophylaxis  Heparin  Code Status: Full  Family Communication: Granddaughter at bedside  Disposition Plan: Observation. Home when stable  Consultants Cardiology  Procedures  Echocardiogram  Antibiotics   Anti-infectives    Start     Dose/Rate Route Frequency Ordered Stop   08/05/16 2200  cefTRIAXone (ROCEPHIN) 1 g in dextrose 5 % 50 mL IVPB     1 g 100 mL/hr over 30 Minutes Intravenous Every 24 hours 08/05/16 0703     08/05/16 0015  cefTRIAXone (ROCEPHIN) 1 g in dextrose 5 % 50 mL IVPB     1 g 100 mL/hr over 30 Minutes Intravenous  Once 08/04/16 2357 08/05/16 0046      Subjective:   Gloria Odom seen and examined today.  Denies further nausea or vomiting. Denies abdominal pain or diarrhea/constipation. Denies chest pain or shortness of breath, dizziness, headache. Denies dysuria. Does feel weak. Inquires about going home.   Objective:   Vitals:   08/05/16 0009 08/05/16 0436 08/05/16 0953 08/05/16 1337  BP: 129/68 112/60 (!) 105/48 106/64  Pulse: 83 78 84 85  Resp: (!) 27 20 20 20   Temp: 98.7 F (37.1 C) 98.9 F (37.2 C) 98.6 F (37 C) 98.7 F (37.1 C)  TempSrc: Oral Oral Oral Oral  SpO2: 100% 98% 100% 95%  Height:  Intake/Output Summary (Last 24 hours) at 08/05/16 1420 Last data filed at 08/05/16 0800  Gross per 24 hour  Intake             1240 ml  Output                0 ml  Net             1240 ml   There were no vitals filed for this visit.  Exam  General: Well developed, well nourished, NAD, appears stated  age  HEENT: NCAT, mucous membranes moist.   Cardiovascular: S1 S2 auscultated, 3/6 SEM, RRR  Respiratory: Clear to auscultation bilaterally with equal chest rise  Abdomen: Soft, nontender, nondistended, + bowel sounds  Extremities: warm dry without cyanosis clubbing or edema  Neuro: AAOx3, nonfocal  Psych: Normal affect and demeanor    Data Reviewed: I have personally reviewed following labs and imaging studies  CBC:  Recent Labs Lab 08/03/16 1651 08/04/16 1749 08/05/16 0013 08/05/16 0550  WBC 11.1* 13.3* 13.1* 13.4*  NEUTROABS  --  10.1*  --   --   HGB  --  9.9* 9.5* 8.8*  HCT 29.8* 30.2* 29.1* 26.5*  MCV 76* 78.2 77.4* 77.0*  PLT 338 315 310 274   Basic Metabolic Panel:  Recent Labs Lab 08/03/16 1651 08/04/16 1749 08/05/16 0013 08/05/16 0550  NA 131* 126*  --  132*  K 3.7 3.3*  --  3.2*  CL 92* 90*  --  98*  CO2 25 24  --  25  GLUCOSE 162* 129*  --  103*  BUN 39* 40*  --  37*  CREATININE 1.96* 2.09* 1.91* 1.77*  CALCIUM 9.0 8.6*  --  8.2*  MG  --   --  1.8  --    GFR: CrCl cannot be calculated (Unknown ideal weight.). Liver Function Tests:  Recent Labs Lab 08/03/16 1651 08/04/16 1749 08/05/16 0550  AST 26 37 25  ALT 11 19 17   ALKPHOS 103 105 90  BILITOT 0.5 0.8 0.7  PROT 6.3 6.6 5.4*  ALBUMIN 3.5 2.8* 2.2*    Recent Labs Lab 08/03/16 1651  LIPASE 17   No results for input(s): AMMONIA in the last 168 hours. Coagulation Profile: No results for input(s): INR, PROTIME in the last 168 hours. Cardiac Enzymes:  Recent Labs Lab 08/05/16 0013 08/05/16 0934  TROPONINI 0.12* 0.43*   BNP (last 3 results) No results for input(s): PROBNP in the last 8760 hours. HbA1C: No results for input(s): HGBA1C in the last 72 hours. CBG: No results for input(s): GLUCAP in the last 168 hours. Lipid Profile: No results for input(s): CHOL, HDL, LDLCALC, TRIG, CHOLHDL, LDLDIRECT in the last 72 hours. Thyroid Function Tests:  Recent Labs   08/05/16 0013 08/05/16 0934  TSH 0.128*  --   FREET4  --  1.60*   Anemia Panel:  Recent Labs  08/05/16 0934  VITAMINB12 577  FOLATE 7.8  FERRITIN 111  TIBC 284  IRON 11*  RETICCTPCT 0.4   Urine analysis:    Component Value Date/Time   COLORURINE AMBER (A) 08/04/2016 2230   APPEARANCEUR TURBID (A) 08/04/2016 2230   LABSPEC 1.008 08/04/2016 2230   PHURINE 6.0 08/04/2016 2230   GLUCOSEU NEGATIVE 08/04/2016 2230   HGBUR LARGE (A) 08/04/2016 2230   BILIRUBINUR NEGATIVE 08/04/2016 2230   KETONESUR NEGATIVE 08/04/2016 2230   PROTEINUR 100 (A) 08/04/2016 2230   NITRITE POSITIVE (A) 08/04/2016 2230   LEUKOCYTESUR LARGE (A) 08/04/2016 2230  Sepsis Labs: @LABRCNTIP (procalcitonin:4,lacticidven:4)  )No results found for this or any previous visit (from the past 240 hour(s)).    Radiology Studies: Dg Chest 2 View  Result Date: 08/04/2016 CLINICAL DATA:  Fatigue and vomiting EXAM: CHEST  2 VIEW COMPARISON:  None. FINDINGS: Hyperinflation. No focal infiltrate or effusion. Borderline cardiomegaly with atherosclerosis. No pneumothorax. Advanced degenerative changes of the bilateral shoulders. IMPRESSION: Hyperinflation without acute infiltrate or edema. Electronically Signed   By: Jasmine Pang M.D.   On: 08/04/2016 20:48     Scheduled Meds: . amLODipine  10 mg Oral Daily  . aspirin EC  81 mg Oral Daily  . metoprolol succinate  25 mg Oral Daily  . multivitamin with minerals  1 tablet Oral Daily   Continuous Infusions: . sodium chloride 100 mL/hr at 08/05/16 1029  . cefTRIAXone (ROCEPHIN)  IV    . heparin 800 Units/hr (08/05/16 1312)     LOS: 0 days   Time Spent in minutes   30 minutes  Daly Whipkey D.O. on 08/05/2016 at 2:20 PM  Between 7am to 7pm - Pager - [phone]  After 7pm go to [email or web address] - password [email or web address]  And look for the night coverage person covering for me after hours  [facility] Office  [phone]

## 2016-08-05 NOTE — Progress Notes (Signed)
  Echocardiogram 2D Echocardiogram has been performed.  Lashara Urey L Androw 08/05/2016, 2:47 PM

## 2016-08-05 NOTE — Progress Notes (Signed)
ANTICOAGULATION CONSULT NOTE - Initial Consult  Pharmacy Consult for Heparin Indication: chest pain/ACS  No Known Allergies  Patient Measurements: Height: 5\' 6"  (167.6 cm) IBW/kg (Calculated) : 59.3  Estimated weight: 150 lbs = 68 kg Heparin Dosing Weight: 68 kg  Vital Signs: Temp: 98.6 F (37 C) (06/01 0953) Temp Source: Oral (06/01 0953) BP: 105/48 (06/01 0953) Pulse Rate: 84 (06/01 0953)  Labs:  Recent Labs  08/04/16 1749 08/05/16 0013 08/05/16 0550 08/05/16 0934  HGB 9.9* 9.5* 8.8*  --   HCT 30.2* 29.1* 26.5*  --   PLT 315 310 274  --   CREATININE 2.09* 1.91* 1.77*  --   TROPONINI  --  0.12*  --  0.43*    CrCl cannot be calculated (Unknown ideal weight.).   Medical History: Past Medical History:  Diagnosis Date  . Bifascicular block   . Diastolic dysfunction   . Dyslipidemia   . Hypertension   . LVH (left ventricular hypertrophy)     Medications:  Prescriptions Prior to Admission  Medication Sig Dispense Refill Last Dose  . amLODipine (NORVASC) 10 MG tablet TAKE 1 TABLET EVERY DAY (Patient taking differently: Take 10 mg by mouth once a day) 90 tablet 3 08/04/2016 at 1100  . aspirin EC 81 MG tablet Take 81 mg by mouth daily.   08/03/2016 at 1800  . diphenhydrAMINE (BENADRYL) 25 mg capsule Take 50 mg by mouth at bedtime as needed for itching or allergies.    Past Week at Unknown time  . metoprolol succinate (TOPROL-XL) 25 MG 24 hr tablet TAKE 1 TABLET BY MOUTH EVERY DAY. (Patient taking differently: Take 25 mg by mouth once a day) 90 tablet 3 08/04/2016 at 1100  . Multiple Vitamins-Minerals (CENTRUM SILVER ADULT 50+) TABS Take 1 tablet by mouth daily.   08/04/2016 at am  . naproxen sodium (ANAPROX) 220 MG tablet Take 440 mg by mouth daily as needed (for pain).   PRN at PRN  . valsartan-hydrochlorothiazide (DIOVAN-HCT) 320-25 MG tablet TAKE 1 TABLET EVERY DAY (Patient taking differently: Take 1 tablet by mouth once a day) 90 tablet 3 08/04/2016 at 1100     Assessment: 83 yo F seen by cardiology outpt 5/30 then called back to present to the ED because of AKI.  Pt has been nauseated, anorexic, weak, and fatigued for the past few days.  Now with noted Trop elevation.  To start heparin.  Note hx of anemia with Hgb currently 8-9.  Goal of Therapy:  Heparin level 0.3-0.7 units/ml Monitor platelets by anticoagulation protocol: Yes   Plan:  Heparin 2000 unit IV bolus x 1 Heparin infusion at 800 units/hr. Heparin level in 8 hours. Heparin level and CBC daily while on heparin.  Toys 'R' UsKimberly Sheena Simonis, Pharm.D., BCPS Clinical Pharmacist Pager: [phone] Clinical phone for 08/05/2016 from 8:30-4:00 is [phone]. After 4pm, please call Main Rx [phone]) for assistance. 08/05/2016 12:47 PM

## 2016-08-05 NOTE — Progress Notes (Addendum)
Nutrition Brief Note  RD consulted for assessment of nutrition requirements/status.  Wt Readings from Last 15 Encounters:  06/16/15 154 lb (69.9 kg)  06/16/14 158 lb 6.4 oz (71.8 kg)  06/12/13 156 lb 3.2 oz (70.9 kg)  Pt was weighed on bed scale during time of visit, which revealed 152 lbs. Weight stable.  Current diet order is heart, patient is consuming approximately 75% of meals at this time. Pt reports appetite has improved and has not other difficulties. Pt consuming at least 3 meals a day with an Ensure 1-2 times daily. Labs and medications reviewed.  Nutrition-Focused physical exam completed. Findings are no fat depletion, mild muscle depletion, and mild edema. Muscle depletion likely related to the natural aging process.  No nutrition interventions warranted at this time. If nutrition issues arise, please consult RD.   Gloria SmilingStephanie Terica Yogi, MS, RD, LDN Pager # [phone] After hours/ weekend pager # [phone]

## 2016-08-05 NOTE — Care Management Obs Status (Signed)
MEDICARE OBSERVATION STATUS NOTIFICATION   Patient Details  Name: Gloria Odom MRN: [id number] Date of Birth: [birth year]   Medicare Observation Status Notification Given:  Yes    Lawerance Sabalebbie TRUE Garciamartinez, RN 08/05/2016, 5:27 PM

## 2016-08-05 NOTE — Consult Note (Signed)
Cardiology Consult    Patient ID: Gloria Odom MRN: [id number], DOB/AGE: 13-Dec-1920   Admit date: 08/04/2016 Date of Consult: 08/05/2016  Primary Physician: Patient, No Pcp Per Primary Cardiologist: Hilty Requesting Provider: Catha Gosselin Reason for Consultation: Elevated Trop  Gloria Odom is a 83 y.o. female who is being seen today for the evaluation of elevated trop at the request of Dr. Catha Gosselin.   Patient Profile    83 yo female with PMH of hypertension, LVH, and diastolic dysfunction who presented with weakness and dehydration.   Past Medical History   Past Medical History:  Diagnosis Date  . Bifascicular block   . Diastolic dysfunction   . Dyslipidemia   . Hypertension   . LVH (left ventricular hypertrophy)     Past Surgical History:  Procedure Laterality Date  . TRANSTHORACIC ECHOCARDIOGRAM  2007   EF normal; mild MR & TR; mild AV regurg & AV moderately sclerotic     Allergies  No Known Allergies  History of Present Illness    Gloria Odom is a 83 yo female with PMH of HTN, LVH and diastolic dysfunction. She is followed by Dr. Rennis Golden in the outpatient setting. Currently lives at home, but is heavily dependent on family to assist with ADLS. Known to have mild AI based off echo from 2007. When she saw Dr. Rennis Golden in 2017 reported increasing weakness, but was not interested in any surgical procedures at that time, therefore repeat echo was deferred. Was seen in the office a few days ago on 5/30 and reported an episode of vomiting, and decreased appetite. Also noted to be tachycardic. Blood work was done in the office, and family/patient was to determine whether they wanted to repeat echo to determine progression of her aortic valve.   She presented to the ED last night with worsening weakness and AKI noted on her labs work from the office on 08/03/16, Cr 2.09, K+ 3.3. In the ED Cr was 1.91, K+ 3.2. UA was positive for leukocytes, and nitrates. She was admitted for IVF and  started on antibiotics/. EKG showed SR with known RBBB, and PACs. Trop 0.12>>0.43. She denied any chest pain, dyspnea, diaphoresis, or lightheadedness prior to admission.   Inpatient Medications    . amLODipine  10 mg Oral Daily  . aspirin EC  81 mg Oral Daily  . metoprolol succinate  25 mg Oral Daily  . multivitamin with minerals  1 tablet Oral Daily    Family History    Family History  Problem Relation Age of Onset  . Diabetes Mother   . Kidney failure Mother   . Emphysema Father   . Stroke Maternal Grandmother   . Stroke Maternal Grandfather   . Emphysema Brother   . Asthma Sister   . Cancer Sister   . Hyperlipidemia Sister   . Heart attack Child     Social History    Social History   Social History  . Marital status: Widowed    Spouse name: N/A  . Number of children: 2  . Years of education: N/A   Occupational History  . Not on file.   Social History Main Topics  . Smoking status: Former Games developer  . Smokeless tobacco: Never Used  . Alcohol use No  . Drug use: No  . Sexual activity: No   Other Topics Concern  . Not on file   Social History Narrative  . No narrative on file     Review of Systems    See HPI.  All other systems reviewed and are otherwise negative except as noted above.  Physical Exam    Blood pressure 106/64, pulse 85, temperature 98.7 F (37.1 C), temperature source Oral, resp. rate 20, height 5\' 6"  (1.676 m), SpO2 95 %.  General: Pleasant frail older AAF, NAD Psych: Normal affect. Neuro: Alert and oriented X 3. Moves all extremities spontaneously. HEENT: Normal  Neck: Supple without bruits, mild JVD. Lungs:  Resp regular and unlabored, CTA. Heart: RRR no s3, s4, 4/6 systolic murmurs. Abdomen: Soft, non-tender, non-distended, BS + x 4.  Extremities: No clubbing, cyanosis or edema. DP/PT/Radials 2+ and equal bilaterally.  Labs    Troponin (Point of Care Test) No results for input(s): TROPIPOC in the last 72 hours.  Recent  Labs  08/05/16 0013 08/05/16 0934  TROPONINI 0.12* 0.43*   Lab Results  Component Value Date   WBC 13.4 (H) 08/05/2016   HGB 8.8 (L) 08/05/2016   HCT 26.5 (L) 08/05/2016   MCV 77.0 (L) 08/05/2016   PLT 274 08/05/2016    Recent Labs Lab 08/05/16 0550  NA 132*  K 3.2*  CL 98*  CO2 25  BUN 37*  CREATININE 1.77*  CALCIUM 8.2*  PROT 5.4*  BILITOT 0.7  ALKPHOS 90  ALT 17  AST 25  GLUCOSE 103*   No results found for: CHOL, HDL, LDLCALC, TRIG No results found for: [facility]   Radiology Studies    Dg Chest 2 View  Result Date: 08/04/2016 CLINICAL DATA:  Fatigue and vomiting EXAM: CHEST  2 VIEW COMPARISON:  None. FINDINGS: Hyperinflation. No focal infiltrate or effusion. Borderline cardiomegaly with atherosclerosis. No pneumothorax. Advanced degenerative changes of the bilateral shoulders. IMPRESSION: Hyperinflation without acute infiltrate or edema. Electronically Signed   By: Jasmine Pang M.D.   On: 08/04/2016 20:48    ECG & Cardiac Imaging    EKG: SR with known RBBB, PACs  Echo: pending  Assessment & Plan    83 yo female with PMH of hypertension, LVH, and diastolic dysfunction who presented with weakness and dehydration.   1. Weakness/dehydration: Reports episode of vomiting prior to admission, but did not have any appetite and had decreased oral intake. Cr 2, prior to admission, then 1.7 last night. Getting gentle IV hydration. Would be cautious in the setting of known AI. Reports feeling better today.  2. AKI: Likely prerenal 2/2 to poor PO intake. Follow BMET   3. UTI?: On antibiotics per primary  4. Elevated Troponin: Mild elevation at 0.12>>0.43 in the setting of infection and dehydration. Given her age, likely has some degree of CAD but would not favor invasive work up given advanced age, and Cr. She is at a low function status, not interested in invasive procedures. No chest pain prior to admission or dyspnea.  -- on BB, amlodipine, no ACEi/ARB 2/2 to renal  function -- trend enzymes -- f/u echo  5. AS: Noted on previous echo in 2007, and demonstrated clinical symptoms at last couple of office visit, but has not been interested in pursuing further work up. Likely not a good TAVR candidate, would probably be her only option. This was discussed in depth with the patient and her family at office visit 2 days ago with Dr. Rennis Golden. Does not appear volume overloaded on exam. Would again be cautious with IVFs.  -- echo in process.   Janice Coffin, NP-C Pager [phone] 08/05/2016, 2:08 PM   The patient was seen, examined and discussed with Laverda Page, NP-C and I agree with  the above.   83 year old female with known aortic stenosis and diastolic dysfunction who was admitted after food poisoning with dehydration. The patient had minimally elevated troponin and is being treated for UTI.  I have reviewed patient's echocardiogram that shows moderate concentric LVH with hyperdynamic LV EF 65-70%, her aortic valve is severely thickened and calcified with restricted leaflet opening and transaortic gradients in the moderate range however aortic valve area in a severe range. On physical exam she doesn't have S2 consistent with severe aortic stenosis. The patient is fairly active, she denies any chest pain she gets short of breath when she walks longer denies any dizziness or syncope or falls. Right now she is probably not a candidate for TAVR as she doesn't have significant symptoms, however she's not opposed to the procedure once her symptoms get worse. Her troponin was minimally elevated and now down trending I would discontinue heparin drip. I would not proceed with ischemic workup unless her symptoms don't improve with treatment of UTI and hydration.  Tobias AlexanderKatarina Harden Bramer, MD 08/05/2016

## 2016-08-06 DIAGNOSIS — I10 Essential (primary) hypertension: Secondary | ICD-10-CM | POA: Diagnosis not present

## 2016-08-06 DIAGNOSIS — I352 Nonrheumatic aortic (valve) stenosis with insufficiency: Secondary | ICD-10-CM | POA: Diagnosis not present

## 2016-08-06 DIAGNOSIS — E876 Hypokalemia: Secondary | ICD-10-CM

## 2016-08-06 DIAGNOSIS — R1110 Vomiting, unspecified: Secondary | ICD-10-CM | POA: Diagnosis not present

## 2016-08-06 DIAGNOSIS — N179 Acute kidney failure, unspecified: Secondary | ICD-10-CM | POA: Diagnosis not present

## 2016-08-06 LAB — BASIC METABOLIC PANEL
Anion gap: 9 (ref 5–15)
BUN: 34 mg/dL — AB (ref 6–20)
CALCIUM: 8 mg/dL — AB (ref 8.9–10.3)
CHLORIDE: 98 mmol/L — AB (ref 101–111)
CO2: 22 mmol/L (ref 22–32)
CREATININE: 1.76 mg/dL — AB (ref 0.44–1.00)
GFR calc non Af Amer: 24 mL/min — ABNORMAL LOW (ref >60)
GFR, EST AFRICAN AMERICAN: 28 mL/min — AB (ref >60)
Glucose, Bld: 114 mg/dL — ABNORMAL HIGH (ref 65–99)
Potassium: 3.4 mmol/L — ABNORMAL LOW (ref 3.5–5.1)
SODIUM: 129 mmol/L — AB (ref 135–145)

## 2016-08-06 LAB — T3, FREE: T3 FREE: 2.5 pg/mL (ref 2.0–4.4)

## 2016-08-06 LAB — CBC
HEMATOCRIT: 25.4 % — AB (ref 36.0–46.0)
HEMOGLOBIN: 8.3 g/dL — AB (ref 12.0–15.0)
MCH: 25.2 pg — AB (ref 26.0–34.0)
MCHC: 32.7 g/dL (ref 30.0–36.0)
MCV: 77 fL — ABNORMAL LOW (ref 78.0–100.0)
Platelets: 293 10*3/uL (ref 150–400)
RBC: 3.3 MIL/uL — ABNORMAL LOW (ref 3.87–5.11)
RDW: 14.4 % (ref 11.5–15.5)
WBC: 12.9 10*3/uL — ABNORMAL HIGH (ref 4.0–10.5)

## 2016-08-06 LAB — URINE CULTURE: Culture: <10000 — AB

## 2016-08-06 MED ORDER — ENSURE ENLIVE PO LIQD: 237.0000 mL | Freq: Two times a day (BID) | ORAL | 0 refills | Status: AC

## 2016-08-06 MED ORDER — CEFUROXIME AXETIL 250 MG PO TABS: 250.0000 mg | ORAL_TABLET | Freq: Two times a day (BID) | ORAL | 0 refills | Status: AC

## 2016-08-06 MED ORDER — POTASSIUM CHLORIDE CRYS ER 20 MEQ PO TBCR
40.0000 meq | EXTENDED_RELEASE_TABLET | Freq: Once | ORAL | Status: AC
  Administered 2016-08-06: 40 meq via ORAL
  Filled 2016-08-06: qty 2

## 2016-08-06 NOTE — Progress Notes (Signed)
Patient in a stable condition, discharge education reviewed with patient and her daughter at bedside, they verbalised understanding, iv removed tele dc ccmd notified, patient belongings at bedside

## 2016-08-06 NOTE — Progress Notes (Signed)
Reviewed consult note by Dr. Delton SeeNelson.  Felt not to be a candidate for TAVR at this time as she does not have significant symptoms.  Dr. Rennis GoldenHilty has already discussed this with patient and family at OV a few days ago.  Her trop is minimally elevated likely secondary to food poisoning and dehydration as well as demand ischemia from UTI and AS.  Last 2 trop low (0.12>0.43>0.05>0.03).  BNP minimally elevated and likely due to AS with LV pressure overload.  Per Dr. Lindaann SloughNelson's note was not volume overloaded on exam.  No further ischemic workup at this time unless weakness does not improve with treatment of UTI and dehydration.  We will set up f/u appt with Dr. Rennis GoldenHilty in the office in 1-2 weeks.  No other recs at this time. Will sign off.  Please call with any questions.

## 2016-08-06 NOTE — Discharge Summary (Signed)
Physician Discharge Summary  Gloria Odom ZOX:[id number] DOB: Jul 31, 1920 DOA: 08/04/2016  PCP: Patient, No Pcp Per  Admit date: 08/04/2016 Discharge date: 08/06/2016  Time spent: 45 minutes  Recommendations for Outpatient Follow-up:  Patient will be discharged to home.  Patient will need to follow up with primary care provider within one week of discharge, repeat BMP. Follow up with Dr. Rennis Golden, cardiologist, in 1-2 weeks.  Patient should continue medications as prescribed.  Patient should follow a heart healthy diet.   Discharge Diagnoses:  Acute kidney injury on chronic kidney disease, stage III Elevated troponin/Elevated BNP Sepsis secondary to Possible UTI Abnormal thyroid testing Essential hypertension Microcytic anemia  Discharge Condition: Stable  Diet recommendation: heart healthy  Filed Weights   08/05/16 1337 08/06/16 0532  Weight: 69.9 kg (154 lb) 68 kg (149 lb 14.4 oz)    History of present illness:  On 08/04/2016 by Dr. Julieanne Cotton Lawrenceis a 83 y.o.femalewith history of hypertension, diastolic dysfunction and aortic stenosis was advised to come to the ER by patient's cardiologist as patient's labs showed acute renal failure. Patient had followed up with cardiologist 2 days ago for routine check when patient was found to be tachycardic. 3 days ago patient had nausea and vomiting one episode following which patient has been anorexic and weak and fatigued. Patient also has been found to be tachycardic despite taking her metoprolol. Lab works was done in the office and showed patient's creatinine has increased from baseline of 1-1.9 and advised to come to the ER. Patient denies any chest pain shortness of breath. Only had one episode of nausea and vomiting following which patient has benign for appetite. Denies any diarrhea.  Hospital Course:  Acute kidney injury on chronic kidney disease, stage III -Likely secondary to dehydration, poor oral intake, prerenal  causes/Sepsis -Creatinine upon admission 2.09 (baseline ? Cr 1.06 on 08/18/2015- only one lab in Epic, 2010 Cr 0.9, 2012 1.07) -Given IVF, Creatinine improving 1.76 -Repeat BMP in one week, encouraged hydration -Avoid nephrotoxic agents, naproxen -Hold Diovan/HCT  Elevated troponin/Elevated BNP -Possibly demand vs related to AKI -Troponin peaked at 43, and trended downward -No reports of chest pain and does not appear to be volume overloaded -Echocardiogram 65-70%, grade 1 diastolic dysfunction -heparin drip was discontinued by cardiology -BNP was 492.9 prior to admission (Echocardiogram in 2007 showed normal LVEF). Elevated BNP felt to likely to be due to AS with LV pressure overload.  -patient to follow up with Dr. Rennis Golden  Sepsis secondary to Possible UTI -Patient was tachycardic, tachypneic, with leukocytosis on admission -Leukocytosis and vitals improving -UA: Area, TNTC WBC, positive nitrates, large leukocytes -Urine culture showed <10K insignificant growth -Was placed on ceftriaxone -Will discharge with ceftin  Abnormal thyroid testing -TSH 0.128 -FT4 1.6 -Given acute illness, would repeat thyroid testing in a few weeks  Essential hypertension -Continue amlodipine, metoprolol  Microcytic anemia -hemoglobin in June 2017 was 11.8 -Currently 8.3 (was 9.9 upon admission), suspect drop secondary to IVF/dilutional component -Iron 11, ferritin 111, sat ratio 4, B12: 577, folate 7.8 -Continue iron supplementation   Aortic stenosis -Echocardiogram AVA with severe aortic stenosis  Hypokalemia -replaced, repeat BMP in one week.    Hyponatremia -likely secondary to dehydration/vomiting -Repeat BMP in one week  Consultants Cardiology  Procedures  Echocardiogram  Discharge Exam: Vitals:   08/06/16 0532 08/06/16 0937  BP: (!) 113/50 (!) 115/56  Pulse: 88 90  Resp: 18 18  Temp: 98.7 F (37.1 C) 98.6 F (37 C)   Patient has  no complaints this morning. Ready  to go home. Denies chest pain, shortness of breath, abdominal pain, N/V/D/C.    General: Well developed, thin, elderly, NAD  HEENT: NCAT, mucous membranes moist.  Cardiovascular: S1 S2 auscultated, 3/6 SEM, RRR  Respiratory: Clear to auscultation bilaterally with equal chest rise  Abdomen: Soft, nontender, nondistended, + bowel sounds  Extremities: warm dry without cyanosis clubbing or edema  Neuro: AAOx3, nonfocal  Psych: Pleasant, appropriate mood and affect  Discharge Instructions Discharge Instructions    Discharge instructions    Complete by:  As directed    Patient will be discharged to home.  Patient will need to follow up with primary care provider within one week of discharge, repeat BMP. Follow up with Dr. Rennis Golden, cardiologist, in 1-2 weeks.  Patient should continue medications as prescribed.  Patient should follow a heart healthy diet.     Current Discharge Medication List    START taking these medications   Details  cefUROXime (CEFTIN) 250 MG tablet Take 1 tablet (250 mg total) by mouth 2 (two) times daily. Qty: 10 tablet, Refills: 0    feeding supplement, ENSURE ENLIVE, (ENSURE ENLIVE) LIQD Take 237 mLs by mouth 2 (two) times daily between meals. Qty: 60 Bottle, Refills: 0      CONTINUE these medications which have NOT CHANGED   Details  amLODipine (NORVASC) 10 MG tablet TAKE 1 TABLET EVERY DAY Qty: 90 tablet, Refills: 3    aspirin EC 81 MG tablet Take 81 mg by mouth daily.    diphenhydrAMINE (BENADRYL) 25 mg capsule Take 50 mg by mouth at bedtime as needed for itching or allergies.     metoprolol succinate (TOPROL-XL) 25 MG 24 hr tablet TAKE 1 TABLET BY MOUTH EVERY DAY. Qty: 90 tablet, Refills: 3    Multiple Vitamins-Minerals (CENTRUM SILVER ADULT 50+) TABS Take 1 tablet by mouth daily.      STOP taking these medications     naproxen sodium (ANAPROX) 220 MG tablet      valsartan-hydrochlorothiazide (DIOVAN-HCT) 320-25 MG tablet        No Known  Allergies Follow-up Information    Hilty, Lisette Abu, MD. Schedule an appointment as soon as possible for a visit in 1 week(s).   Specialty:  Cardiology Why:  Repeat labs, discus aortic stenosis  Contact information: [address] [place] [address] [phone]            The results of significant diagnostics from this hospitalization (including imaging, microbiology, ancillary and laboratory) are listed below for reference.    Significant Diagnostic Studies: Dg Chest 2 View  Result Date: 08/04/2016 CLINICAL DATA:  Fatigue and vomiting EXAM: CHEST  2 VIEW COMPARISON:  None. FINDINGS: Hyperinflation. No focal infiltrate or effusion. Borderline cardiomegaly with atherosclerosis. No pneumothorax. Advanced degenerative changes of the bilateral shoulders. IMPRESSION: Hyperinflation without acute infiltrate or edema. Electronically Signed   By: Jasmine Pang M.D.   On: 08/04/2016 20:48    Microbiology: Recent Results (from the past 240 hour(s))  Culture, Urine     Status: Abnormal   Collection Time: 08/05/16  7:33 AM  Result Value Ref Range Status   Specimen Description URINE, CLEAN CATCH  Final   Special Requests NONE  Final   Culture <10,000 COLONIES/mL INSIGNIFICANT GROWTH (A)  Final   Report Status 08/06/2016 FINAL  Final     Labs: Basic Metabolic Panel:  Recent Labs Lab 08/03/16 1651 08/04/16 1749 08/05/16 0013 08/05/16 0550 08/06/16 0331  NA 131* 126*  --  132* 129*  K 3.7 3.3*  --  3.2* 3.4*  CL 92* 90*  --  98* 98*  CO2 25 24  --  25 22  GLUCOSE 162* 129*  --  103* 114*  BUN 39* 40*  --  37* 34*  CREATININE 1.96* 2.09* 1.91* 1.77* 1.76*  CALCIUM 9.0 8.6*  --  8.2* 8.0*  MG  --   --  1.8  --   --    Liver Function Tests:  Recent Labs Lab 08/03/16 1651 08/04/16 1749 08/05/16 0550  AST 26 37 25  ALT 11 19 17   ALKPHOS 103 105 90  BILITOT 0.5 0.8 0.7  PROT 6.3 6.6 5.4*  ALBUMIN 3.5 2.8* 2.2*    Recent Labs Lab 08/03/16 1651    LIPASE 17   No results for input(s): AMMONIA in the last 168 hours. CBC:  Recent Labs Lab 08/03/16 1651 08/04/16 1749 08/05/16 0013 08/05/16 0550 08/06/16 0331  WBC 11.1* 13.3* 13.1* 13.4* 12.9*  NEUTROABS  --  10.1*  --   --   --   HGB  --  9.9* 9.5* 8.8* 8.3*  HCT 29.8* 30.2* 29.1* 26.5* 25.4*  MCV 76* 78.2 77.4* 77.0* 77.0*  PLT 338 315 310 274 293   Cardiac Enzymes:  Recent Labs Lab 08/05/16 0013 08/05/16 0934 08/05/16 1317 08/05/16 1915  TROPONINI 0.12* 0.43* 0.05* 0.03*   BNP: BNP (last 3 results)  Recent Labs  08/03/16 1651  BNP 492.9*    ProBNP (last 3 results) No results for input(s): PROBNP in the last 8760 hours.  CBG: No results for input(s): GLUCAP in the last 168 hours.     SignedEdsel Petrin:  Adi Seales  [facility] 08/06/2016, 11:27 AM

## 2016-08-06 NOTE — Discharge Instructions (Signed)
Acute Kidney Injury, Adult Acute kidney injury is a sudden worsening of kidney function. The kidneys are organs that have several jobs. They filter the blood to remove waste products and extra fluid. They also maintain a healthy balance of minerals and hormones in the body, which helps control blood pressure and keep bones strong. With this condition, your kidneys do not do their jobs as well as they should. This condition ranges from mild to severe. Over time it may develop into long-lasting (chronic) kidney disease. Early detection and treatment may prevent acute kidney injury from developing into a chronic condition. What are the causes? Common causes of this condition include:  A problem with blood flow to the kidneys. This may be caused by:  Low blood pressure (hypotension) or shock.  Blood loss.  Heart and blood vessel (cardiovascular) disease.  Severe burns.  Liver disease.  Direct damage to the kidneys. This may be caused by:  Certain medicines.  A kidney infection.  Poisoning.  Being around or in contact with toxic substances.  A surgical wound.  A hard, direct hit to the kidney area.  A sudden blockage of urine flow. This may be caused by:  Cancer.  Kidney stones.  An enlarged prostate in males. What are the signs or symptoms? Symptoms of this condition may not be obvious until the condition becomes severe. Symptoms of this condition can include:  Tiredness (lethargy), or difficulty staying awake.  Nausea or vomiting.  Swelling (edema) of the face, legs, ankles, or feet.  Problems with urination, such as:  Abdominal pain, or pain along the side of your stomach (flank).  Decreased urine production.  Decrease in the force of urine flow.  Muscle twitches and cramps, especially in the legs.  Confusion or trouble concentrating.  Loss of appetite.  Fever. How is this diagnosed? This condition may be diagnosed with tests, including:  Blood  tests.  Urine tests.  Imaging tests.  A test in which a sample of tissue is removed from the kidneys to be examined under a microscope (kidney biopsy). How is this treated? Treatment for this condition depends on the cause and how severe the condition is. In mild cases, treatment may not be needed. The kidneys may heal on their own. In more severe cases, treatment will involve:  Treating the cause of the kidney injury. This may involve changing any medicines you are taking or adjusting your dosage.  Fluids. You may need specialized IV fluids to balance your body's needs.  Having a catheter placed to drain urine and prevent blockages.  Preventing problems from occurring. This may mean avoiding certain medicines or procedures that can cause further injury to the kidneys. In some cases treatment may also require:  A procedure to remove toxic wastes from the body (dialysis or continuous renal replacement therapy - CRRT).  Surgery. This may be done to repair a torn kidney, or to remove the blockage from the urinary system. Follow these instructions at home: Medicines   Take over-the-counter and prescription medicines only as told by your health care provider.  Do not take any new medicines without your health care provider's approval. Many medicines can worsen your kidney damage.  Do not take any vitamin and mineral supplements without your health care provider's approval. Many nutritional supplements can worsen your kidney damage. Lifestyle   If your health care provider prescribed changes to your diet, follow them. You may need to decrease the amount of protein you eat.  Achieve and maintain a   healthy weight. If you need help with this, ask your health care provider.  Start or continue an exercise plan. Try to exercise at least 30 minutes a day, 5 days a week.  Do not use any tobacco products, such as cigarettes, chewing tobacco, and e-cigarettes. If you need help quitting, ask  your health care provider. General instructions   Keep track of your blood pressure. Report changes in your blood pressure as told by your health care provider.  Stay up to date with immunizations. Ask your health care provider which immunizations you need.  Keep all follow-up visits as told by your health care provider. This is important. Where to find more information:  American Association of Kidney Patients: www.aakp.org  National Kidney Foundation: www.kidney.org  American Kidney Fund: www.akfinc.org  [employer] [facility]:  www.lifeoptions.org  www.kidneyschool.org Contact a health care provider if:  Your symptoms get worse.  You develop new symptoms. Get help right away if:  You develop symptoms of worsening kidney disease, which include:  Headaches.  Abnormally dark or light skin.  Easy bruising.  Frequent hiccups.  Chest pain.  Shortness of breath.  End of menstruation in women.  Seizures.  Confusion or altered mental status.  Abdominal or back pain.  Itchiness.  You have a fever.  Your body is producing less urine.  You have pain or bleeding when you urinate. Summary  Acute kidney injury is a sudden worsening of kidney function.  Acute kidney injury can be caused by problems with blood flow to the kidneys, direct damage to the kidneys, and sudden blockage of urine flow.  Symptoms of this condition may not be obvious until it becomes severe. Symptoms may include edema, lethargy, confusion, nausea or vomiting, and problems passing urine.  This condition can usually be diagnosed with blood tests, urine tests, and imaging tests. Sometimes a kidney biopsy is done to diagnose this condition.  Treatment for this condition often involves treating the underlying cause. It is treated with fluids, medicines, dialysis, diet changes, or surgery. This information is not intended to replace advice given to you by your health care provider.  Make sure you discuss any questions you have with your health care provider. Document Released: 09/06/2010 Document Revised: 02/12/2016 Document Reviewed: 02/12/2016 Elsevier Interactive Patient Education  2017 [employer].  

## 2016-08-09 LAB — URINE CULTURE: Culture: >=100000 — AB

## 2016-08-10 ENCOUNTER — Telehealth: Payer: Self-pay | Admitting: Internal Medicine

## 2016-08-10 LAB — CULTURE, BLOOD (ROUTINE X 2)
CULTURE: NO GROWTH
CULTURE: NO GROWTH
Special Requests: ADEQUATE
Special Requests: ADEQUATE

## 2016-08-10 NOTE — Telephone Encounter (Signed)
Received FMLA Forms -[employer] for [employer] .  Forms put with Dr Blanchie DessertHilty's correspondence for review, complete and sign. lp

## 2016-08-11 LAB — PROTEIN ELECTROPHORESIS, SERUM
A/G Ratio: 0.8 (ref 0.7–1.7)
ALBUMIN ELP: 2.5 g/dL — AB (ref 2.9–4.4)
ALPHA-1-GLOBULIN: 0.4 g/dL (ref 0.0–0.4)
Alpha-2-Globulin: 1.1 g/dL — ABNORMAL HIGH (ref 0.4–1.0)
Beta Globulin: 0.8 g/dL (ref 0.7–1.3)
GLOBULIN, TOTAL: 3.1 g/dL (ref 2.2–3.9)
Gamma Globulin: 0.7 g/dL (ref 0.4–1.8)
Total Protein ELP: 5.6 g/dL — ABNORMAL LOW (ref 6.0–8.5)

## 2016-08-12 ENCOUNTER — Other Ambulatory Visit: Payer: Self-pay | Admitting: Internal Medicine

## 2016-08-23 ENCOUNTER — Encounter: Payer: Self-pay | Admitting: Internal Medicine

## 2016-08-23 ENCOUNTER — Ambulatory Visit (INDEPENDENT_AMBULATORY_CARE_PROVIDER_SITE_OTHER): Payer: Medicare Other | Admitting: Internal Medicine

## 2016-08-23 VITALS — BP 143/78 | HR 86 | Ht 66.0 in | Wt 142.0 lb

## 2016-08-23 DIAGNOSIS — N184 Chronic kidney disease, stage 4 (severe): Secondary | ICD-10-CM

## 2016-08-23 DIAGNOSIS — I10 Essential (primary) hypertension: Secondary | ICD-10-CM

## 2016-08-23 DIAGNOSIS — N179 Acute kidney failure, unspecified: Secondary | ICD-10-CM | POA: Diagnosis not present

## 2016-08-23 DIAGNOSIS — I352 Nonrheumatic aortic (valve) stenosis with insufficiency: Secondary | ICD-10-CM | POA: Diagnosis not present

## 2016-08-23 NOTE — Patient Instructions (Signed)
Your physician recommends that you return for lab work TODAY  Your physician recommends that you schedule a follow-up appointment in: THREE MONTHS with Dr. Rennis GoldenHilty

## 2016-08-23 NOTE — Progress Notes (Signed)
Grossly normal   OFFICE NOTE  Chief Complaint:  Follow-up hospitalization  Primary Care Physician: Patient, No Pcp Per  HPI:  Gloria Odom  is a pleasant, 83 year old female. No primary care doctor. She has a history of hypertension, LVH, and diastolic dysfunction. Overall, she has done incredibly well without any hospitalizations for diastolic heart failure. She does have underlying right bifascicular block with left axis deviation of -51, which has been stable. She denies any chest pain, worsening shortness of breath, palpitations, presyncope, or syncopal symptoms. She occasionally does have some right hip pain, which she relates that it improved with Advil. Overall, for her age, she is doing remarkably well.  She is very sharp mentally!  Gloria Odom returns today. She is doing incredibly well. Her birthday is coming up in a few months and she'll be 89. She denies any chest pain or worsening shortness of breath. She occasionally gets some lightheadedness and dizziness. Blood pressure was low today at 108/68.  Gloria Odom returns today for follow-up. She seems to be doing well although is slowing somewhat. She says she can only walk about 500 feet before she has to stop because of some weakness. Otherwise blood pressure seems to be well-controlled. Today it's 126/62. She ambulates with a cane and has difficulty getting in and out of stores. She's desiring a wheelchair to help with ambulation. Her granddaughter generally takes her around and therefore the wheelchair needs to be light weight. A walker would not resolve the issue of activities of daily living because she cannot walk long distances. She also has generalized weakness and is unable to propel herself in a standard heavy wheelchair. Also of note, she has both AS and AI murmurs today. A review of her last echocardiogram was in 2007 which showed moderate aortic sclerosis with mild AI. I believe she is now developed some mild aortic  stenosis. We discussed a repeat echocardiogram however given her deconditioning and the fact that she is not interested in any surgical procedures, I do not see the relevance of repeating an echocardiogram. I will continue to follow this clinically.  08/03/2016  Gloria Odom returns today for follow-up. She is accompanied by her daughter and granddaughter. This is an annual visit. She does not have a primary care provider. When I last saw her a year ago we discussed her aortic stenosis. In 2007 she had moderate aortic stenosis and clinically had moderate to severe aortic stenosis on exam last year. After discussion with her family with the term and that she would not 1 to have surgery or intervention and an echocardiogram therefore was not pursued. Today her family and the patient indicates that this past Sunday she became ill. This was acute included vomiting 1 and has had anorexia since then. In addition she's had fatigue without any significant shortness of breath or chest pain. She denies fevers, cough, diarrhea or sick contacts but has had chills. Appetite is been decreased significantly. She reports good urine output and denies any dysuria. She has had some occasional vaginal spotting which was noted a year ago. She was apparently seen in the walk in GYN clinic for this and told there was not much that could be done. She is noted to be tachycardic today with a heart rate of 108 and blood pressure normal 126/60. She reports she did not take her metoprolol today. Family noted however she has been tachycardic recently despite taking her medications.  08/23/2016  Gloria Odom returns today for follow-up. She was admitted  on the office when I last saw her for acute renal insufficiency and dehydration. She was found to have a possible urinary tract infection. She was treated and her creatinine improved somewhat however down to 1.76 at discharge. Since discharge she feels fairly well although does get short  of breath with exertion. She denies any chest pain. She did undergo echocardiogram at the hospital which showed an EF of 65-70%, grade 1 diastolic dysfunction and severe aortic valve stenosis with a valve area around 0.8 cm. There was moderate pulmonary hypertension with an RVSP of 65 mmHg. She discussed with some of my partners that she may be interested in aortic valve replacement via a trans-catheter approach. I discussed this further with her and her family today and now she's not so certain that she wants to have valve surgery. We did discuss the increased risk of mortality given her age and the fact that she now has severe aortic stenosis. This mortality risk increases significantly over the next year.  PMHx:  Past Medical History:  Diagnosis Date  . Bifascicular block   . Diastolic dysfunction   . Dyslipidemia   . Hypertension   . LVH (left ventricular hypertrophy)     Past Surgical History:  Procedure Laterality Date  . TRANSTHORACIC ECHOCARDIOGRAM  2007   EF normal; mild MR & TR; mild AV regurg & AV moderately sclerotic    FAMHx:  Family History  Problem Relation Age of Onset  . Diabetes Mother   . Kidney failure Mother   . Emphysema Father   . Stroke Maternal Grandmother   . Stroke Maternal Grandfather   . Emphysema Brother   . Asthma Sister   . Cancer Sister   . Hyperlipidemia Sister   . Heart attack Child     SOCHx:   reports that she has quit smoking. She has never used smokeless tobacco. She reports that she does not drink alcohol or use drugs.  ALLERGIES:  No Known Allergies  ROS: Pertinent items noted in HPI and remainder of comprehensive ROS otherwise negative.  HOME MEDS: Current Outpatient Prescriptions  Medication Sig Dispense Refill  . amLODipine (NORVASC) 10 MG tablet TAKE 1 TABLET EVERY DAY (Patient taking differently: Take 10 mg by mouth once a day) 90 tablet 3  . aspirin EC 81 MG tablet Take 81 mg by mouth daily.    . diphenhydrAMINE (BENADRYL)  25 mg capsule Take 50 mg by mouth at bedtime as needed for itching or allergies.     . feeding supplement, ENSURE ENLIVE, (ENSURE ENLIVE) LIQD Take 237 mLs by mouth 2 (two) times daily between meals. 60 Bottle 0  . metoprolol succinate (TOPROL-XL) 25 MG 24 hr tablet TAKE 1 TABLET BY MOUTH EVERY DAY. 90 tablet 3  . Multiple Vitamins-Minerals (CENTRUM SILVER ADULT 50+) TABS Take 1 tablet by mouth daily.     No current facility-administered medications for this visit.     LABS/IMAGING: No results found for this or any previous visit (from the past 48 hour(s)). No results found.  VITALS: BP (!) 143/78 (BP Location: Left Arm, Patient Position: Sitting, Cuff Size: Normal)   Pulse 86   Ht 5\' 6"  (1.676 m)   Wt 142 lb (64.4 kg)   SpO2 98%   BMI 22.92 kg/m   EXAM: General appearance: alert and no distress Neck: no carotid bruit and no JVD Lungs: clear to auscultation bilaterally Heart: regular rate and rhythm, S1: normal, S2: absent and systolic murmur: late systolic 3/6, crescendo at 2nd  right intercostal space Abdomen: soft, non-tender; bowel sounds normal; no masses,  no organomegaly Extremities: extremities normal, atraumatic, no cyanosis or edema Pulses: 2+ and symmetric Skin: Skin color, texture, turgor normal. No rashes or lesions Neurologic: GroPsych: Mood, affect normaly normal Psych: Mood, affect normal  EKG: Deferred  ASSESSMENT: 1. Recent admission for dehydration/UTI 2. Bifascicular block 3. PACs 4. Physical deconditioning 5. Severe aortic stenosis 6. Hypertension-controlled 7. Dyslipidemia-diet controlled 8. CKD 4  PLAN: 1.   Mrs. Fetsch was recently admitted for dehydration and AK Ion see Katie 4. She was found to have possible urinary tract infection which was treated. She is physically deconditioned and get short of breath with moderate exertion. She has severe aortic stenosis by most recent echocardiogram with an EF 65-70% and at least moderate pulmonary  hypertension. Ongoing discussion about TAVR which I'm recommending, but she is not ready to discuss that yet. I discussed this at length with the family today and she will contact me if she changes her mind. We will go ahead and repeat a metabolic profile today to see if her renal function is improved anymore or if this is her new baseline creatinine. This could certainly affect her risk for TAVR complications if she decides to pursue this.  Follow-up with me in 3 months.  Chrystie Nose, MD, [facility] Attending Cardiologist [facility]  Chrystie Nose 08/23/2016, 3:12 PM

## 2016-08-23 NOTE — Telephone Encounter (Signed)
Faxed completed & signed FMLA papers to [phone] Copy made for records Original copy provided to patient/family @ appt 08/23/16

## 2016-08-24 LAB — BASIC METABOLIC PANEL
BUN/Creatinine Ratio: 9 — ABNORMAL LOW (ref 12–28)
BUN: 9 mg/dL — ABNORMAL LOW (ref 10–36)
CO2: 22 mmol/L (ref 20–29)
Calcium: 9.3 mg/dL (ref 8.7–10.3)
Chloride: 97 mmol/L (ref 96–106)
Creatinine, Ser: 1.01 mg/dL — ABNORMAL HIGH (ref 0.57–1.00)
GFR calc Af Amer: 55 mL/min/{1.73_m2} — ABNORMAL LOW (ref >59)
GFR, EST NON AFRICAN AMERICAN: 48 mL/min/{1.73_m2} — AB (ref >59)
Glucose: 82 mg/dL (ref 65–99)
POTASSIUM: 4.5 mmol/L (ref 3.5–5.2)
SODIUM: 136 mmol/L (ref 134–144)

## 2016-11-28 ENCOUNTER — Ambulatory Visit (INDEPENDENT_AMBULATORY_CARE_PROVIDER_SITE_OTHER): Payer: Medicare Other | Admitting: Internal Medicine

## 2016-11-28 ENCOUNTER — Encounter: Payer: Self-pay | Admitting: Internal Medicine

## 2016-11-28 VITALS — BP 138/74 | HR 74 | Ht 66.0 in | Wt 134.6 lb

## 2016-11-28 DIAGNOSIS — R634 Abnormal weight loss: Secondary | ICD-10-CM | POA: Diagnosis not present

## 2016-11-28 DIAGNOSIS — Z86018 Personal history of other benign neoplasm: Secondary | ICD-10-CM | POA: Diagnosis not present

## 2016-11-28 DIAGNOSIS — I352 Nonrheumatic aortic (valve) stenosis with insufficiency: Secondary | ICD-10-CM

## 2016-11-28 DIAGNOSIS — N838 Other noninflammatory disorders of ovary, fallopian tube and broad ligament: Secondary | ICD-10-CM

## 2016-11-28 DIAGNOSIS — I10 Essential (primary) hypertension: Secondary | ICD-10-CM

## 2016-11-28 DIAGNOSIS — N839 Noninflammatory disorder of ovary, fallopian tube and broad ligament, unspecified: Secondary | ICD-10-CM | POA: Diagnosis not present

## 2016-11-28 DIAGNOSIS — R109 Unspecified abdominal pain: Secondary | ICD-10-CM

## 2016-11-28 NOTE — Patient Instructions (Addendum)
Dr. Rennis Golden has ordered an abdominal CT to be done at [facility] will need lab work today - BMET  Your physician recommends that you schedule a follow-up appointment with Dr. Rennis Golden after your CT - OK to double book if needed

## 2016-11-29 DIAGNOSIS — N838 Other noninflammatory disorders of ovary, fallopian tube and broad ligament: Secondary | ICD-10-CM | POA: Insufficient documentation

## 2016-11-29 DIAGNOSIS — Z86018 Personal history of other benign neoplasm: Secondary | ICD-10-CM | POA: Insufficient documentation

## 2016-11-29 DIAGNOSIS — R634 Abnormal weight loss: Secondary | ICD-10-CM | POA: Insufficient documentation

## 2016-11-29 DIAGNOSIS — R109 Unspecified abdominal pain: Secondary | ICD-10-CM | POA: Insufficient documentation

## 2016-11-29 LAB — BASIC METABOLIC PANEL
BUN/Creatinine Ratio: 13 (ref 12–28)
BUN: 12 mg/dL (ref 10–36)
CALCIUM: 9.1 mg/dL (ref 8.7–10.3)
CO2: 26 mmol/L (ref 20–29)
CREATININE: 0.89 mg/dL (ref 0.57–1.00)
Chloride: 95 mmol/L — ABNORMAL LOW (ref 96–106)
GFR, EST AFRICAN AMERICAN: 64 mL/min/{1.73_m2} (ref >59)
GFR, EST NON AFRICAN AMERICAN: 56 mL/min/{1.73_m2} — AB (ref >59)
Glucose: 91 mg/dL (ref 65–99)
Potassium: 3.3 mmol/L — ABNORMAL LOW (ref 3.5–5.2)
Sodium: 136 mmol/L (ref 134–144)

## 2016-11-29 NOTE — Progress Notes (Signed)
Grossly normal   OFFICE NOTE  Chief Complaint:  Weight loss  Primary Care Physician: Patient, No Pcp Per  HPI:  Gloria Odom  is a pleasant, 83 year old female. No primary care doctor. She has a history of hypertension, LVH, and diastolic dysfunction. Overall, she has done incredibly well without any hospitalizations for diastolic heart failure. She does have underlying right bifascicular block with left axis deviation of -51, which has been stable. She denies any chest pain, worsening shortness of breath, palpitations, presyncope, or syncopal symptoms. She occasionally does have some right hip pain, which she relates that it improved with Advil. Overall, for her age, she is doing remarkably well.  She is very sharp mentally!  Gloria Odom returns today. She is doing incredibly well. Her birthday is coming up in a few months and she'll be 89. She denies any chest pain or worsening shortness of breath. She occasionally gets some lightheadedness and dizziness. Blood pressure was low today at 108/68.  Gloria Odom returns today for follow-up. She seems to be doing well although is slowing somewhat. She says she can only walk about 500 feet before she has to stop because of some weakness. Otherwise blood pressure seems to be well-controlled. Today it's 126/62. She ambulates with a cane and has difficulty getting in and out of stores. She's desiring a wheelchair to help with ambulation. Her granddaughter generally takes her around and therefore the wheelchair needs to be light weight. A walker would not resolve the issue of activities of daily living because she cannot walk long distances. She also has generalized weakness and is unable to propel herself in a standard heavy wheelchair. Also of note, she has both AS and AI murmurs today. A review of her last echocardiogram was in 2007 which showed moderate aortic sclerosis with mild AI. I believe she is now developed some mild aortic stenosis. We  discussed a repeat echocardiogram however given her deconditioning and the fact that she is not interested in any surgical procedures, I do not see the relevance of repeating an echocardiogram. I will continue to follow this clinically.  08/03/2016  Gloria Odom returns today for follow-up. She is accompanied by her daughter and granddaughter. This is an annual visit. She does not have a primary care provider. When I last saw her a year ago we discussed her aortic stenosis. In 2007 she had moderate aortic stenosis and clinically had moderate to severe aortic stenosis on exam last year. After discussion with her family with the term and that she would not 1 to have surgery or intervention and an echocardiogram therefore was not pursued. Today her family and the patient indicates that this past Sunday she became ill. This was acute included vomiting 1 and has had anorexia since then. In addition she's had fatigue without any significant shortness of breath or chest pain. She denies fevers, cough, diarrhea or sick contacts but has had chills. Appetite is been decreased significantly. She reports good urine output and denies any dysuria. She has had some occasional vaginal spotting which was noted a year ago. She was apparently seen in the walk in GYN clinic for this and told there was not much that could be done. She is noted to be tachycardic today with a heart rate of 108 and blood pressure normal 126/60. She reports she did not take her metoprolol today. Family noted however she has been tachycardic recently despite taking her medications.  08/23/2016  Gloria Odom returns today for follow-up. She was admitted  on the office when I last saw her for acute renal insufficiency and dehydration. She was found to have a possible urinary tract infection. She was treated and her creatinine improved somewhat however down to 1.76 at discharge. Since discharge she feels fairly well although does get short of breath  with exertion. She denies any chest pain. She did undergo echocardiogram at the hospital which showed an EF of 65-70%, grade 1 diastolic dysfunction and severe aortic valve stenosis with a valve area around 0.8 cm. There was moderate pulmonary hypertension with an RVSP of 65 mmHg. She discussed with some of my partners that she may be interested in aortic valve replacement via a trans-catheter approach. I discussed this further with her and her family today and now she's not so certain that she wants to have valve surgery. We did discuss the increased risk of mortality given her age and the fact that she now has severe aortic stenosis. This mortality risk increases significantly over the next year.  11/28/2016  Gloria Odom is seen today in follow-up. She returns posthospitalization. She has severe aortic stenosis and we have discussed the possibility of transcatheter aortic valve replacement. On presentation today her daughter notes that she's had a significant decrease in appetite as well as associated weight loss. She says despite eating she is continued to lose weight. She reports easy fullness and early satiety with some abdominal discomfort. As previously reported she was seen in GYN clinic and had ultrasound last year which suggested that there was a mass in the left adnexa, possibly concerning for malignancy. She was also told that she had fibroids. She denies any recurrent GU bleeding but has had that in the past. She's never had a colonoscopy.  PMHx:  Past Medical History:  Diagnosis Date  . Bifascicular block   . Diastolic dysfunction   . Dyslipidemia   . Hypertension   . LVH (left ventricular hypertrophy)     Past Surgical History:  Procedure Laterality Date  . TRANSTHORACIC ECHOCARDIOGRAM  2007   EF normal; mild MR & TR; mild AV regurg & AV moderately sclerotic    FAMHx:  Family History  Problem Relation Age of Onset  . Diabetes Mother   . Kidney failure Mother   . Emphysema  Father   . Stroke Maternal Grandmother   . Stroke Maternal Grandfather   . Emphysema Brother   . Asthma Sister   . Cancer Sister   . Hyperlipidemia Sister   . Heart attack Child     SOCHx:   reports that she has quit smoking. She has never used smokeless tobacco. She reports that she does not drink alcohol or use drugs.  ALLERGIES:  No Known Allergies  ROS: Pertinent items noted in HPI and remainder of comprehensive ROS otherwise negative.  HOME MEDS: Current Outpatient Prescriptions  Medication Sig Dispense Refill  . amLODipine (NORVASC) 10 MG tablet TAKE 1 TABLET EVERY DAY (Patient taking differently: Take 10 mg by mouth once a day) 90 tablet 3  . aspirin EC 81 MG tablet Take 81 mg by mouth daily.    . diphenhydrAMINE (BENADRYL) 25 mg capsule Take 50 mg by mouth at bedtime as needed for itching or allergies.     . feeding supplement, ENSURE ENLIVE, (ENSURE ENLIVE) LIQD Take 237 mLs by mouth 2 (two) times daily between meals. 60 Bottle 0  . metoprolol succinate (TOPROL-XL) 25 MG 24 hr tablet TAKE 1 TABLET BY MOUTH EVERY DAY. 90 tablet 3  . Multiple Vitamins-Minerals (  CENTRUM SILVER ADULT 50+) TABS Take 1 tablet by mouth daily.     No current facility-administered medications for this visit.     LABS/IMAGING: Results for orders placed or performed in visit on 11/28/16 (from the past 48 hour(s))  Basic metabolic panel     Status: Abnormal   Collection Time: 11/28/16  4:21 PM  Result Value Ref Range   Glucose 91 65 - 99 mg/dL   BUN 12 10 - 36 mg/dL   Creatinine, Ser 4.09 0.57 - 1.00 mg/dL   GFR calc non Af Amer 56 (L) >59 mL/min/1.73   GFR calc Af Amer 64 >59 mL/min/1.73   BUN/Creatinine Ratio 13 12 - 28   Sodium 136 134 - 144 mmol/L   Potassium 3.3 (L) 3.5 - 5.2 mmol/L   Chloride 95 (L) 96 - 106 mmol/L   CO2 26 20 - 29 mmol/L   Calcium 9.1 8.7 - 10.3 mg/dL   No results found.  VITALS: BP 138/74   Pulse 74   Ht  (1.676 m)   Wt 134 lb 9.6 oz (61.1 kg)   BMI  21.73 kg/m   EXAM: General appearance: alert, cachectic and no distress Neck: no carotid bruit, no JVD and thyroid not enlarged, symmetric, no tenderness/mass/nodules Lungs: clear to auscultation bilaterally Heart: regular rate and rhythm, S1: normal, S2: absent and systolic murmur: late systolic 3/6, crescendo at 2nd right intercostal space Abdomen: Mild abdominal fullness with possible mass in the left lower quadrant Extremities: extremities normal, atraumatic, no cyanosis or edema Pulses: 2+ and symmetric Skin: Skin color, texture, turgor normal. No rashes or lesions Neurologic: Grossly normal Psych: Pleasant  EKG: Normal sinus rhythm with sinus arrhythmia, RBBB, moderate voltage criteria for LVH and 74-personally reviewed  ASSESSMENT: 1. Weight loss with abdominal fullness-history of adnexal mass on ultrasound 2. Recent admission for dehydration/UTI 3. Bifascicular block 4. PACs 5. Physical deconditioning 6. Severe aortic stenosis 7. Hypertension-controlled 8. Dyslipidemia-diet controlled 9. CKD 4  PLAN: 1.   Gloria Odom was noted to have a left adnexal mass on ultrasound a year ago and has had worsening abdominal fullness, decreased appetite and unintentional weight loss. I'm concerned about malignancy. She was also noted to have some fibroids and this was I think postulated to be the cause of her GU bleeding. At the time she was apparently told there is nothing that can be done for her. I think would be important for [place] to get a diagnosis which be helpful prognostically. If there is some evidence for cancer, will need to consider treatment options and likely she would not be considered a candidate for transcatheter aortic valve replacement.  Follow-up with me afterward.  Chrystie Nose, MD, [facility] Attending Cardiologist [facility]  Lisette Abu Hilty 11/29/2016, 10:07 AM

## 2016-12-01 ENCOUNTER — Ambulatory Visit
Admission: RE | Admit: 2016-12-01 | Discharge: 2016-12-01 | Disposition: A | Discharge status: Home / Self Care | Payer: Medicare Other | Source: Ambulatory Visit | Attending: Internal Medicine | Admitting: Internal Medicine

## 2016-12-01 DIAGNOSIS — R634 Abnormal weight loss: Secondary | ICD-10-CM

## 2016-12-01 DIAGNOSIS — N838 Other noninflammatory disorders of ovary, fallopian tube and broad ligament: Secondary | ICD-10-CM

## 2016-12-01 DIAGNOSIS — R109 Unspecified abdominal pain: Secondary | ICD-10-CM

## 2016-12-01 DIAGNOSIS — Z86018 Personal history of other benign neoplasm: Secondary | ICD-10-CM

## 2016-12-01 MED ORDER — IOPAMIDOL (ISOVUE-300) INJECTION 61%
100.0000 mL | Freq: Once | INTRAVENOUS | Status: AC | PRN
  Administered 2016-12-01: 100 mL via INTRAVENOUS

## 2016-12-14 ENCOUNTER — Encounter: Payer: Self-pay | Admitting: Internal Medicine

## 2016-12-14 ENCOUNTER — Ambulatory Visit (INDEPENDENT_AMBULATORY_CARE_PROVIDER_SITE_OTHER): Payer: Medicare Other | Admitting: Internal Medicine

## 2016-12-14 VITALS — BP 146/52 | HR 78 | Ht 66.0 in | Wt 133.0 lb

## 2016-12-14 DIAGNOSIS — Z515 Encounter for palliative care: Secondary | ICD-10-CM

## 2016-12-14 DIAGNOSIS — I352 Nonrheumatic aortic (valve) stenosis with insufficiency: Secondary | ICD-10-CM

## 2016-12-14 DIAGNOSIS — N839 Noninflammatory disorder of ovary, fallopian tube and broad ligament, unspecified: Secondary | ICD-10-CM

## 2016-12-14 DIAGNOSIS — R634 Abnormal weight loss: Secondary | ICD-10-CM | POA: Diagnosis not present

## 2016-12-14 DIAGNOSIS — N838 Other noninflammatory disorders of ovary, fallopian tube and broad ligament: Secondary | ICD-10-CM

## 2016-12-14 NOTE — Patient Instructions (Signed)
Your physician wants you to follow-up in: ONE YEAR with Dr. Hilty. You will receive a reminder letter in the mail two months in advance. If you don't receive a letter, please call our office to schedule the follow-up appointment.  

## 2016-12-14 NOTE — Progress Notes (Signed)
Grossly normal   OFFICE NOTE  Chief Complaint:  Weight loss, follow-up CT scan  Primary Care Physician: Patient, No Pcp Per  HPI:  Gloria Odom  is a pleasant, 83 year old female. No primary care doctor. She has a history of hypertension, LVH, and diastolic dysfunction. Overall, she has done incredibly well without any hospitalizations for diastolic heart failure. She does have underlying right bifascicular block with left axis deviation of -51, which has been stable. She denies any chest pain, worsening shortness of breath, palpitations, presyncope, or syncopal symptoms. She occasionally does have some right hip pain, which she relates that it improved with Advil. Overall, for her age, she is doing remarkably well.  She is very sharp mentally!  Gloria Odom returns today. She is doing incredibly well. Her birthday is coming up in a few months and she'll be 89. She denies any chest pain or worsening shortness of breath. She occasionally gets some lightheadedness and dizziness. Blood pressure was low today at 108/68.  Gloria Odom returns today for follow-up. She seems to be doing well although is slowing somewhat. She says she can only walk about 500 feet before she has to stop because of some weakness. Otherwise blood pressure seems to be well-controlled. Today it's 126/62. She ambulates with a cane and has difficulty getting in and out of stores. She's desiring a wheelchair to help with ambulation. Her granddaughter generally takes her around and therefore the wheelchair needs to be light weight. A walker would not resolve the issue of activities of daily living because she cannot walk long distances. She also has generalized weakness and is unable to propel herself in a standard heavy wheelchair. Also of note, she has both AS and AI murmurs today. A review of her last echocardiogram was in 2007 which showed moderate aortic sclerosis with mild AI. I believe she is now developed some mild aortic  stenosis. We discussed a repeat echocardiogram however given her deconditioning and the fact that she is not interested in any surgical procedures, I do not see the relevance of repeating an echocardiogram. I will continue to follow this clinically.  08/03/2016  Gloria Odom returns today for follow-up. She is accompanied by her daughter and granddaughter. This is an annual visit. She does not have a primary care provider. When I last saw her a year ago we discussed her aortic stenosis. In 2007 she had moderate aortic stenosis and clinically had moderate to severe aortic stenosis on exam last year. After discussion with her family with the term and that she would not 1 to have surgery or intervention and an echocardiogram therefore was not pursued. Today her family and the patient indicates that this past Sunday she became ill. This was acute included vomiting 1 and has had anorexia since then. In addition she's had fatigue without any significant shortness of breath or chest pain. She denies fevers, cough, diarrhea or sick contacts but has had chills. Appetite is been decreased significantly. She reports good urine output and denies any dysuria. She has had some occasional vaginal spotting which was noted a year ago. She was apparently seen in the walk in GYN clinic for this and told there was not much that could be done. She is noted to be tachycardic today with a heart rate of 108 and blood pressure normal 126/60. She reports she did not take her metoprolol today. Family noted however she has been tachycardic recently despite taking her medications.  08/23/2016  Gloria Odom returns today for follow-up.  She was admitted on the office when I last saw her for acute renal insufficiency and dehydration. She was found to have a possible urinary tract infection. She was treated and her creatinine improved somewhat however down to 1.76 at discharge. Since discharge she feels fairly well although does get short  of breath with exertion. She denies any chest pain. She did undergo echocardiogram at the hospital which showed an EF of 65-70%, grade 1 diastolic dysfunction and severe aortic valve stenosis with a valve area around 0.8 cm. There was moderate pulmonary hypertension with an RVSP of 65 mmHg. She discussed with some of my partners that she may be interested in aortic valve replacement via a trans-catheter approach. I discussed this further with her and her family today and now she's not so certain that she wants to have valve surgery. We did discuss the increased risk of mortality given her age and the fact that she now has severe aortic stenosis. This mortality risk increases significantly over the next year.  11/28/2016  Gloria Odom is seen today in follow-up. She returns posthospitalization. She has severe aortic stenosis and we have discussed the possibility of transcatheter aortic valve replacement. On presentation today her daughter notes that she's had a significant decrease in appetite as well as associated weight loss. She says despite eating she is continued to lose weight. She reports easy fullness and early satiety with some abdominal discomfort. As previously reported she was seen in GYN clinic and had ultrasound last year which suggested that there was a mass in the left adnexa, possibly concerning for malignancy. She was also told that she had fibroids. She denies any recurrent GU bleeding but has had that in the past. She's never had a colonoscopy.  12/14/2016  Gloria Odom was seen today in follow-up with her daughter and granddaughter. She underwent outpatient CT scan of the abdomen and pelvis due to weight loss and early satiety. She has a history of adnexal masses as well as fibroid seen on ultrasound last year. She was told that time that there were no treatment options for her. The CT scan showed multiple partially calcified fibroids. There is also a low-attenuation 7 cm mass in the  central uterus is thought to be either degenerated fibroid or endometrial carcinoma. In addition there was a mass on the left adnexa that measured 9 cm which could be pedunculated fibroid or ovarian cancer. I discussed the findings today which are concerning given her history of recent weight loss and loss of appetite to be cancer. I had advised the possible gynecologic oncology referral however after talking with the patient and her family, she feels like she does not when he further tests. She says she feels like she is getting closer to her time to leave the world. Given the context of her abdominal masses, it's unlikely that she would benefit from aggressive therapy for her aortic stenosis. Overall we decided that perhaps a symptomatic approach would be best. After discussion I signed an out of hospital MOLST form today and she indicated that she wants to be DO NOT RESUSCITATE.  PMHx:  Past Medical History:  Diagnosis Date  . Bifascicular block   . Diastolic dysfunction   . Dyslipidemia   . Hypertension   . LVH (left ventricular hypertrophy)     Past Surgical History:  Procedure Laterality Date  . TRANSTHORACIC ECHOCARDIOGRAM  2007   EF normal; mild MR & TR; mild AV regurg & AV moderately sclerotic    FAMHx:  Family History  Problem Relation Age of Onset  . Diabetes Mother   . Kidney failure Mother   . Emphysema Father   . Stroke Maternal Grandmother   . Stroke Maternal Grandfather   . Emphysema Brother   . Asthma Sister   . Cancer Sister   . Hyperlipidemia Sister   . Heart attack Child     SOCHx:   reports that she has quit smoking. She has never used smokeless tobacco. She reports that she does not drink alcohol or use drugs.  ALLERGIES:  No Known Allergies  ROS: Pertinent items noted in HPI and remainder of comprehensive ROS otherwise negative.  HOME MEDS: Current Outpatient Prescriptions  Medication Sig Dispense Refill  . amLODipine (NORVASC) 10 MG tablet TAKE 1  TABLET EVERY DAY (Patient taking differently: Take 10 mg by mouth once a day) 90 tablet 3  . aspirin EC 81 MG tablet Take 81 mg by mouth daily.    . diphenhydrAMINE (BENADRYL) 25 mg capsule Take 50 mg by mouth at bedtime as needed for itching or allergies.     . feeding supplement, ENSURE ENLIVE, (ENSURE ENLIVE) LIQD Take 237 mLs by mouth 2 (two) times daily between meals. 60 Bottle 0  . metoprolol succinate (TOPROL-XL) 25 MG 24 hr tablet TAKE 1 TABLET BY MOUTH EVERY DAY. 90 tablet 3  . Multiple Vitamins-Minerals (CENTRUM SILVER ADULT 50+) TABS Take 1 tablet by mouth daily.     No current facility-administered medications for this visit.     LABS/IMAGING: No results found for this or any previous visit (from the past 48 hour(s)). No results found.  VITALS: BP (!) 146/52   Pulse 78   Ht  (1.676 m)   Wt 133 lb (60.3 kg)   BMI 21.47 kg/m   EXAM: Deferred  EKG: Deferred  ASSESSMENT: 1. Weight loss with abdominal fullness-history of adnexal mass on ultrasound, possible uterine and/or ovarian cancer 2. Recent admission for dehydration/UTI 3. Bifascicular block 4. PACs 5. Physical deconditioning 6. Severe aortic stenosis 7. Hypertension-controlled 8. Dyslipidemia-diet controlled 9. CKD 4  PLAN: 1.   Gloria Odom continues to have weight loss and early satiety. She was found to have possible uterine and ovarian cancer versus fibroids, however her progressive symptoms are concerning. She has severe aortic stenosis and deconditioning as well as advanced chronic kidney disease. She's not likely a candidate for TAVR based on these findings. She reported to me that she felt like her time a shortage she did not want additional testing. After discussion with her family we decided for her to be a DO NOT RESUSCITATE. I filled out a yellow most form which I asked the family displayed a house. I made it clear for her to discuss her wishes with her family. I'll plan to see her back in a  year and would be happy to assist in arrangement of palliative care services as necessary.  Chrystie Nose, MD, [facility] Attending Cardiologist [facility]  Chrystie Nose 12/14/2016, 4:48 PM

## 2017-02-19 ENCOUNTER — Encounter (HOSPITAL_BASED_OUTPATIENT_CLINIC_OR_DEPARTMENT_OTHER): Payer: Self-pay | Admitting: Emergency Medicine

## 2017-02-19 ENCOUNTER — Emergency Department (HOSPITAL_BASED_OUTPATIENT_CLINIC_OR_DEPARTMENT_OTHER): Payer: Medicare Other

## 2017-02-19 ENCOUNTER — Other Ambulatory Visit: Payer: Self-pay

## 2017-02-19 ENCOUNTER — Emergency Department (HOSPITAL_BASED_OUTPATIENT_CLINIC_OR_DEPARTMENT_OTHER)
Admission: EM | Admit: 2017-02-19 | Discharge: 2017-02-19 | Disposition: A | Discharge status: Home / Self Care | Payer: Medicare Other | Attending: Physician Assistant | Admitting: Physician Assistant

## 2017-02-19 DIAGNOSIS — I82411 Acute embolism and thrombosis of right femoral vein: Secondary | ICD-10-CM | POA: Insufficient documentation

## 2017-02-19 DIAGNOSIS — I129 Hypertensive chronic kidney disease with stage 1 through stage 4 chronic kidney disease, or unspecified chronic kidney disease: Secondary | ICD-10-CM | POA: Diagnosis not present

## 2017-02-19 DIAGNOSIS — N184 Chronic kidney disease, stage 4 (severe): Secondary | ICD-10-CM | POA: Diagnosis not present

## 2017-02-19 DIAGNOSIS — R2241 Localized swelling, mass and lump, right lower limb: Secondary | ICD-10-CM | POA: Diagnosis present

## 2017-02-19 DIAGNOSIS — Z87891 Personal history of nicotine dependence: Secondary | ICD-10-CM | POA: Insufficient documentation

## 2017-02-19 DIAGNOSIS — Z79899 Other long term (current) drug therapy: Secondary | ICD-10-CM | POA: Insufficient documentation

## 2017-02-19 DIAGNOSIS — Z7982 Long term (current) use of aspirin: Secondary | ICD-10-CM | POA: Insufficient documentation

## 2017-02-19 LAB — CBC WITH DIFFERENTIAL/PLATELET
Basophils Absolute: 0 10*3/uL (ref 0.0–0.1)
Basophils Relative: 0 %
EOS PCT: 2 %
Eosinophils Absolute: 0.3 10*3/uL (ref 0.0–0.7)
HCT: 27.7 % — ABNORMAL LOW (ref 36.0–46.0)
Hemoglobin: 8.7 g/dL — ABNORMAL LOW (ref 12.0–15.0)
LYMPHS ABS: 1.3 10*3/uL (ref 0.7–4.0)
LYMPHS PCT: 11 %
MCH: 23.1 pg — AB (ref 26.0–34.0)
MCHC: 31.4 g/dL (ref 30.0–36.0)
MCV: 73.7 fL — AB (ref 78.0–100.0)
MONO ABS: 0.9 10*3/uL (ref 0.1–1.0)
Monocytes Relative: 8 %
Neutro Abs: 10 10*3/uL — ABNORMAL HIGH (ref 1.7–7.7)
Neutrophils Relative %: 79 %
PLATELETS: 510 10*3/uL — AB (ref 150–400)
RBC: 3.76 MIL/uL — ABNORMAL LOW (ref 3.87–5.11)
RDW: 15.4 % (ref 11.5–15.5)
WBC: 12.6 10*3/uL — ABNORMAL HIGH (ref 4.0–10.5)

## 2017-02-19 LAB — COMPREHENSIVE METABOLIC PANEL
ALBUMIN: 3.3 g/dL — AB (ref 3.5–5.0)
ALT: 8 U/L — ABNORMAL LOW (ref 14–54)
AST: 17 U/L (ref 15–41)
Alkaline Phosphatase: 66 U/L (ref 38–126)
Anion gap: 8 (ref 5–15)
BUN: 12 mg/dL (ref 6–20)
CHLORIDE: 99 mmol/L — AB (ref 101–111)
CO2: 26 mmol/L (ref 22–32)
Calcium: 9.3 mg/dL (ref 8.9–10.3)
Creatinine, Ser: 0.95 mg/dL (ref 0.44–1.00)
GFR calc Af Amer: 58 mL/min — ABNORMAL LOW (ref >60)
GFR calc non Af Amer: 50 mL/min — ABNORMAL LOW (ref >60)
GLUCOSE: 105 mg/dL — AB (ref 65–99)
POTASSIUM: 3.2 mmol/L — AB (ref 3.5–5.1)
Sodium: 133 mmol/L — ABNORMAL LOW (ref 135–145)
Total Bilirubin: 0.4 mg/dL (ref 0.3–1.2)
Total Protein: 7.3 g/dL (ref 6.5–8.1)

## 2017-02-19 LAB — PROTIME-INR
INR: 1.06
Prothrombin Time: 13.7 seconds (ref 11.4–15.2)

## 2017-02-19 LAB — TROPONIN I

## 2017-02-19 MED ORDER — RIVAROXABAN (XARELTO) VTE STARTER PACK (15 & 20 MG): ORAL_TABLET | ORAL | 0 refills | Status: DC

## 2017-02-19 MED ORDER — RIVAROXABAN (XARELTO) EDUCATION KIT FOR DVT/PE PATIENTS
PACK | Freq: Once | Status: AC
  Administered 2017-02-19: 21:00:00

## 2017-02-19 MED ORDER — RIVAROXABAN 15 MG PO TABS
15.0000 mg | ORAL_TABLET | Freq: Once | ORAL | Status: AC
  Administered 2017-02-19: 15 mg via ORAL
  Filled 2017-02-19: qty 1

## 2017-02-19 NOTE — ED Notes (Signed)
Swelling to RLE x 1 week. 2+ pitting edema noted. Some SHOB with exertion. Also c/o right hip pain. No distress at present. Alert. VSS.

## 2017-02-19 NOTE — ED Notes (Signed)
ED Provider at bedside. 

## 2017-02-19 NOTE — Discharge Instructions (Signed)
Please stop her aspirin.  Please do not take ibuprofen.  Please talk with the pharmacist about the things you cannot cannot take with this new medication.  This medicine increases your risk of bleeding.  We discussed this.  If you have any concerns please discuss with a primary care physician.  We will give you this phone number below to call to get in touch with the primary care physician.  To find a primary care or specialty doctor please call [phone] or [phone] to access "[facility] Find a Doctor Service."  You may also go on the [facility] website at [email or web address]  There are also multiple [facility] and [facility] practices throughout the [place] that are frequently accepting new patients. You may find a clinic that is close to your home and contact them.  [facility] -  [address] [place] [phone]  [facility] in [place] (also locations in [place] and [place]) -  [address] [place] [address] [phone]  [facility] -  [address] [place] [address] [phone]

## 2017-02-19 NOTE — ED Notes (Signed)
Patient transported to X-ray 

## 2017-02-19 NOTE — ED Triage Notes (Signed)
Pt c/o right leg swelling x 1 week. Pt denies recent injury or shortness of breath.

## 2017-02-19 NOTE — ED Provider Notes (Signed)
[facility] EMERGENCY DEPARTMENT Provider Note   CSN: [id number] Arrival date & time: 02/19/17  1629     History   Chief Complaint Chief Complaint  Patient presents with  . Leg Swelling    HPI Gloria Odom is a 83 y.o. female.  HPI  Patient is a 83 year old female with past medical history significant for hypertension LVH aortic stenosis, and pelvic mass.  Patient has elected not to pursue any care for either the critical aortic stenosis or the pelvic mass that was noted.  Patient is here today with swelling.  Right leg is swollen over the course of last week.  She noticed no redness no pain.  No injury.  Patient's family convinced her to come here.  Of note patient really has declined a lot of care and this later of life stage.  Past Medical History:  Diagnosis Date  . Bifascicular block   . Diastolic dysfunction   . Dyslipidemia   . Hypertension   . LVH (left ventricular hypertrophy)     Patient Active Problem List   Diagnosis Date Noted  . Encounter for palliative care 12/14/2016  . Abdominal pain 11/29/2016  . Weight loss 11/29/2016  . History of uterine fibroid 11/29/2016  . Ovarian mass, left 11/29/2016  . CKD (chronic kidney disease) stage 4, GFR 15-29 ml/min (HCC) 08/23/2016  . AKI (acute kidney injury) ([place]) 08/23/2016  . Acute renal failure (ARF) ([place]) 08/04/2016  . Normochromic normocytic anemia 08/04/2016  . Vomiting 08/03/2016  . Loss of appetite 08/03/2016  . Chills 08/03/2016  . Aortic valve stenosis with insufficiency 06/16/2015  . Physical deconditioning 05/10/2015  . HTN (hypertension) 06/12/2013  . Dyslipidemia 06/12/2013  . PAC (premature atrial contraction) 06/12/2013    Past Surgical History:  Procedure Laterality Date  . TRANSTHORACIC ECHOCARDIOGRAM  2007   EF normal; mild MR & TR; mild AV regurg & AV moderately sclerotic    OB History    Gravida Para Term Preterm AB Living   '2 2 2     2   '$ SAB TAB Ectopic Multiple  Live Births                   Home Medications    Prior to Admission medications   Medication Sig Start Date End Date Taking? Authorizing Provider  amLODipine (NORVASC) 10 MG tablet TAKE 1 TABLET EVERY DAY Patient taking differently: Take 10 mg by mouth once a day 07/25/16  Yes Hilty, Nadean Corwin, MD  aspirin EC 81 MG tablet Take 81 mg by mouth daily.   Yes [provider]  diphenhydrAMINE (BENADRYL) 25 mg capsule Take 50 mg by mouth at bedtime as needed for itching or allergies.    Yes [provider]  metoprolol succinate (TOPROL-XL) 25 MG 24 hr tablet TAKE 1 TABLET BY MOUTH EVERY DAY. 08/12/16  Yes Hilty, Nadean Corwin, MD  Multiple Vitamins-Minerals (CENTRUM SILVER ADULT 50+) TABS Take 1 tablet by mouth daily.   Yes [provider]  feeding supplement, ENSURE ENLIVE, (ENSURE ENLIVE) LIQD Take 237 mLs by mouth 2 (two) times daily between meals. 08/06/16   Cristal Ford, DO    Family History Family History  Problem Relation Age of Onset  . Diabetes Mother   . Kidney failure Mother   . Emphysema Father   . Stroke Maternal Grandmother   . Stroke Maternal Grandfather   . Emphysema Brother   . Asthma Sister   . Cancer Sister   . Hyperlipidemia Sister   .  Heart attack Child     Social History Social History   Tobacco Use  . Smoking status: Former Research scientist (life sciences)  . Smokeless tobacco: Never Used  Substance Use Topics  . Alcohol use: No  . Drug use: No     Allergies   Patient has no known allergies.   Review of Systems Review of Systems  Constitutional: Positive for fatigue. Negative for activity change and fever.  Respiratory: Negative for cough and shortness of breath.   Cardiovascular: Positive for leg swelling. Negative for chest pain.  Gastrointestinal: Negative for abdominal pain.  All other systems reviewed and are negative.    Physical Exam Updated Vital Signs BP 132/69   Pulse 87   Temp 98.3 F (36.8 C) (Oral)   Resp 20   Ht '5\' 4"'$   (1.626 m)   Wt 62.6 kg (138 lb)   SpO2 100%   BMI 23.69 kg/m   Physical Exam  Constitutional: She is oriented to person, place, and time. She appears well-developed and well-nourished.  Frail but spunky elderly female.  HENT:  Head: Normocephalic and atraumatic.  Eyes: Right eye exhibits no discharge. Left eye exhibits no discharge.  Cardiovascular: Normal rate and regular rhythm.  No murmur heard. Large stenosis murmur.  Pulmonary/Chest: Effort normal and breath sounds normal. She has no wheezes. She has no rales.  Abdominal: Soft. She exhibits no distension. There is no tenderness.  Musculoskeletal:  Swelling to the right lower extremity.  The right lower extremity is roughly 2 times the size of the left lower extreme knee.  Both legs have bilateral pitting edema in the foot.  Neurological: She is oriented to person, place, and time.  Skin: Skin is warm and dry. She is not diaphoretic.  Psychiatric: She has a normal mood and affect.  Nursing note and vitals reviewed.    ED Treatments / Results  Labs (all labs ordered are listed, but only abnormal results are displayed) Labs Reviewed  COMPREHENSIVE METABOLIC PANEL - Abnormal; Notable for the following components:      Result Value   Sodium 133 (*)    Potassium 3.2 (*)    Chloride 99 (*)    Glucose, Bld 105 (*)    Albumin 3.3 (*)    ALT 8 (*)    GFR calc non Af Amer 50 (*)    GFR calc Af Amer 58 (*)    All other components within normal limits  CBC WITH DIFFERENTIAL/PLATELET - Abnormal; Notable for the following components:   WBC 12.6 (*)    RBC 3.76 (*)    Hemoglobin 8.7 (*)    HCT 27.7 (*)    MCV 73.7 (*)    MCH 23.1 (*)    Platelets 510 (*)    Neutro Abs 10.0 (*)    All other components within normal limits  TROPONIN I  PROTIME-INR    EKG  EKG Interpretation None       Radiology Dg Chest 2 View  Result Date: 02/19/2017 CLINICAL DATA:  Lower extremity edema.  Hypertension. EXAM: CHEST  2 VIEW  COMPARISON:  11/04/2016 FINDINGS: There is mild scarring in the left mid lung. There is no edema or consolidation. Heart is upper normal in size with pulmonary vascularity within normal limits. There is aortic atherosclerosis. No adenopathy. There is degenerative change in each shoulder. There is also atherosclerotic calcification in the left carotid artery. There are skin folds on the left. IMPRESSION: Scarring left mid lung. No edema or consolidation. There is aortic  atherosclerosis as well as left carotid artery calcification. Aortic Atherosclerosis (ICD10-I70.0). Electronically Signed   By: Lowella Grip III M.D.   On: 02/19/2017 19:11   US Venous Img Lower Right (dvt Study)  Result Date: 02/19/2017 CLINICAL DATA:  Right lower extremity swelling EXAM: RIGHT LOWER EXTREMITY VENOUS DOPPLER ULTRASOUND TECHNIQUE: Gray-scale sonography with graded compression, as well as color Doppler and duplex ultrasound were performed to evaluate the lower extremity deep venous systems from the level of the common femoral vein and including the common femoral, femoral, profunda femoral, popliteal and calf veins including the posterior tibial, peroneal and gastrocnemius veins when visible. The superficial great saphenous vein was also interrogated. Spectral Doppler was utilized to evaluate flow at rest and with distal augmentation maneuvers in the common femoral, femoral and popliteal veins. COMPARISON:  None. FINDINGS: Contralateral Common Femoral Vein: No evidence of thrombus. Normal compressibility. Common Femoral Vein: Partially occlusive thrombus noted within the right common femoral vein. Saphenofemoral Junction: No evidence of thrombus. Normal compressibility and flow on color Doppler imaging. Profunda Femoral Vein: No evidence of thrombus. Normal compressibility and flow on color Doppler imaging. Femoral Vein: Limited visualization due to edema. Note definite thrombus. Popliteal Vein: No evidence of thrombus.  Normal compressibility, respiratory phasicity and response to augmentation. Calf Veins: Limited visualization due to edema. No definite thrombus. Superficial Great Saphenous Vein: No evidence of thrombus. Normal compressibility. Venous Reflux:  None. Other Findings:  None. IMPRESSION: Partially occlusive thrombus within the right common femoral vein. Somewhat limited visualization in the lower extremity due to edema. Electronically Signed   By: Rolm Baptise M.D.   On: 02/19/2017 19:24    Procedures Procedures (including critical care time)  Medications Ordered in ED Medications  Rivaroxaban (XARELTO) tablet 15 mg (not administered)  rivaroxaban (XARELTO) Education Kit for DVT/PE patients (not administered)     Initial Impression / Assessment and Plan / ED Course  I have reviewed the triage vital signs and the nursing notes.  Pertinent labs & imaging results that were available during my care of the patient were reviewed by me and considered in my medical decision making (see chart for details).    Patient is a 83 year old female with past medical history significant for hypertension LVH aortic stenosis, and pelvic mass.  Patient has elected not to pursue any care for either the critical aortic stenosis or the pelvic mass that was noted.  Patient is here today with swelling.  Right leg is swollen over the course of last week.  She noticed no redness no pain.  No injury.  Patient's family convinced her to come here.  Of note patient really has declined a lot of care and this later of life stage.   6:33 PM Patient has known pelvic mass that she is not choosing to pursue workup on.  I am concerned 1 of 2 things, either she has a large DVT in the right lower extremity secondary to this new cancer.  Or the masses pressing on venous return for the right lower extremity causing the swelling.   8:22 PM Found to have DVT in her femoral vein.  We discussed how usually we would admit, transition to  oral medications, workup further.  However patient does not want to be admitted to hospital.  Had long discussion about the risks of starting anticoagulation.  Patient and family expressed understanding and also expressed that they did not want to have the have a further workup.  I really encouraged that she needs to follow-up  witha primary care physician.  Will start on anticoagulants.  Discussed with pharmacy at [facility] ED and they recommend doing Xarelto '15mg'$  twice daily.  We will get this for 21 days and then have her follow-up with a different provider to continue her care. Final Clinical Impressions(s) / ED Diagnoses   Final diagnoses:  None    ED Discharge Orders    None       Macarthur Critchley, MD 02/19/17 2023

## 2017-03-10 ENCOUNTER — Ambulatory Visit (INDEPENDENT_AMBULATORY_CARE_PROVIDER_SITE_OTHER): Payer: Medicare Other | Admitting: Family Medicine

## 2017-03-10 ENCOUNTER — Encounter: Payer: Self-pay | Admitting: Family Medicine

## 2017-03-10 VITALS — BP 120/72 | HR 76 | Temp 98.4°F | Ht 64.0 in | Wt 131.0 lb

## 2017-03-10 DIAGNOSIS — R630 Anorexia: Secondary | ICD-10-CM | POA: Diagnosis not present

## 2017-03-10 DIAGNOSIS — N859 Noninflammatory disorder of uterus, unspecified: Secondary | ICD-10-CM | POA: Diagnosis not present

## 2017-03-10 DIAGNOSIS — I824Y1 Acute embolism and thrombosis of unspecified deep veins of right proximal lower extremity: Secondary | ICD-10-CM

## 2017-03-10 DIAGNOSIS — N858 Other specified noninflammatory disorders of uterus: Secondary | ICD-10-CM

## 2017-03-10 DIAGNOSIS — R110 Nausea: Secondary | ICD-10-CM | POA: Diagnosis not present

## 2017-03-10 MED ORDER — TRAMADOL HCL 50 MG PO TABS: 50.0000 mg | ORAL_TABLET | Freq: Four times a day (QID) | ORAL | 1 refills | Status: DC | PRN

## 2017-03-10 MED ORDER — DRONABINOL 5 MG PO CAPS: 5.0000 mg | ORAL_CAPSULE | Freq: Two times a day (BID) | ORAL | 2 refills | Status: DC

## 2017-03-10 MED ORDER — RIVAROXABAN 20 MG PO TABS: 20.0000 mg | ORAL_TABLET | Freq: Every day | ORAL | 1 refills | Status: DC

## 2017-03-10 MED ORDER — ONDANSETRON HCL 4 MG PO TABS: 4.0000 mg | ORAL_TABLET | Freq: Three times a day (TID) | ORAL | 0 refills | Status: DC | PRN

## 2017-03-10 NOTE — Progress Notes (Signed)
Chief Complaint  Patient presents with  . Establish Care        New Patient Visit SUBJECTIVE: HPI: Gloria Odom is an 84 y.o.female who is being seen for establishing care.  The patient is accompanied by her daughter and 2 granddaughters.  She has never had a primary care physician before.  In the past, she only saw her cardiologist.  She presents with several issues today.  On 02/19/17, she was diagnosed with a right-sided DVT.  It does not sound like it was provoked.  She was started on Xarelto.  She is on day 19 and will start once daily dosing in a couple days.  She is still having some swelling, however it has gone down.  Intermittent pain persists.  She denies any chest pain, hemoptysis, shortness of breath.  She was also diagnosed with a mass in both her uterus and left adnexa.  They measure 7 cm and 9 cm effectively.  Further imaging was recommended for workup, however the patient does not wish to undergo this.  Unfortunately, she has been experiencing lower abdominal pain, nausea, and decreased appetite/weight loss.  She has been taking Tylenol and that has not been helpful.  She used to take anti-inflammatories, however was told not to do this while taking Xarelto.  She has not met with the palliative care team.  No Known Allergies  Past Medical History:  Diagnosis Date  . Bifascicular block   . Diastolic dysfunction   . Dyslipidemia   . Hypertension   . LVH (left ventricular hypertrophy)    Past Surgical History:  Procedure Laterality Date  . TRANSTHORACIC ECHOCARDIOGRAM  2007   EF normal; mild MR & TR; mild AV regurg & AV moderately sclerotic   Social History   Socioeconomic History  . Marital status: Widowed  Tobacco Use  . Smoking status: Former Research scientist (life sciences)  . Smokeless tobacco: Never Used  Substance and Sexual Activity  . Alcohol use: No  . Drug use: No  . Sexual activity: No    Birth control/protection: Post-menopausal   Family History  Problem Relation Age of  Onset  . Diabetes Mother   . Kidney failure Mother   . Emphysema Father   . Stroke Maternal Grandmother   . Stroke Maternal Grandfather   . Emphysema Brother   . Asthma Sister   . Cancer Sister   . Hyperlipidemia Sister   . Heart attack Child      Current Outpatient Medications:  .  feeding supplement, ENSURE ENLIVE, (ENSURE ENLIVE) LIQD, Take 237 mLs by mouth 2 (two) times daily between meals., Disp: 60 Bottle, Rfl: 0 .  metoprolol succinate (TOPROL-XL) 25 MG 24 hr tablet, TAKE 1 TABLET BY MOUTH EVERY DAY., Disp: 90 tablet, Rfl: 3 .  Multiple Vitamins-Minerals (CENTRUM SILVER ADULT 50+) TABS, Take 1 tablet by mouth daily., Disp: , Rfl:  .  Rivaroxaban 15 & 20 MG TBPK, Take as directed on package: Start with one 39m tablet by mouth twice a day with food. On Day 22, switch to one 242mtablet once a day with food., Disp: 51 each, Rfl: 0 .  amLODipine (NORVASC) 10 MG tablet, TAKE 1 TABLET EVERY DAY (Patient taking differently: Take 10 mg by mouth once a day), Disp: 90 tablet, Rfl: 3 .  diphenhydrAMINE (BENADRYL) 25 mg capsule, Take 50 mg by mouth at bedtime as needed for itching or allergies. , Disp: , Rfl:  .  dronabinol (MARINOL) 5 MG capsule, Take 1 capsule (5 mg total) by  mouth 2 (two) times daily before lunch and supper., Disp: 60 capsule, Rfl: 2 .  ondansetron (ZOFRAN) 4 MG tablet, Take 1 tablet (4 mg total) by mouth every 8 (eight) hours as needed for nausea or vomiting., Disp: 20 tablet, Rfl: 0 .  [START ON 03/21/2017] rivaroxaban (XARELTO) 20 MG TABS tablet, Take 1 tablet (20 mg total) by mouth daily with supper., Disp: 30 tablet, Rfl: 1 .  traMADol (ULTRAM) 50 MG tablet, Take 1 tablet (50 mg total) by mouth every 6 (six) hours as needed., Disp: 120 tablet, Rfl: 1   ROS Cardiovascular: Denies chest pain  GI: +Nausea, +abd pain   OBJECTIVE: BP 120/72 (BP Location: Left Arm, Patient Position: Sitting, Cuff Size: Normal)   Pulse 76   Temp 98.4 F (36.9 C) (Oral)   Ht _0   (1.626 m)   Wt 131 lb (59.4 kg)   SpO2 95%   BMI 22.49 kg/m   Constitutional: -  VS reviewed -  Well developed, well nourished, appears stated age -  No apparent distress  Psychiatric: -  Oriented to person, place, and time -  Memory intact -  Affect and mood normal -  Fluent conversation, good eye contact -  Judgment and insight age appropriate  Eye: -  Conjunctivae clear, no discharge -  Pupils symmetric, round, reactive to light  ENMT: -  MMM  Cardiovascular: -  RRR -  +SEM -  +RLE edema  Respiratory: -  Normal respiratory effort, no accessory muscle use, no retraction -  Breath sounds equal, no wheezes, no ronchi, no crackles  Gastrointestinal: -  Bowel sounds normal -  +TTP over lower abd, no distention, no guarding, no masses  Musculoskeletal: -  No calf ttp  Skin: -  No significant lesion on inspection -  Warm and dry to palpation   ASSESSMENT/PLAN: Poor appetite - Plan: dronabinol (MARINOL) 5 MG capsule  Uterine mass - Plan: traMADol (ULTRAM) 50 MG tablet, dronabinol (MARINOL) 5 MG capsule  Acute deep vein thrombosis (DVT) of proximal vein of right lower extremity (HCC) - Plan: rivaroxaban (XARELTO) 20 MG TABS tablet  Nausea - Plan: ondansetron (ZOFRAN) 4 MG tablet  Based on her presentation, imaging, and history, I am concerned that her issues are caused from a uterine/adnexal carcinoma.  That being said, I appreciate given her age it that she does not wish to pursue this.  In light of this, I will prescribe the above medications for palliation.  We will continue the Xarelto for a total of 3 months.  If the tramadol does not adequately control her pain or causes side effects, I will consider oxycodone.  Continue Tylenol as needed.  Avoid anti-inflammatories while she is on Xarelto. Patient should return in 1 mo. The patient and her family members voiced understanding and agreement to the plan.   [place], DO 03/10/17  4:38 PM

## 2017-03-10 NOTE — Patient Instructions (Signed)
Let us know if you need anything.  Let me know if there are issues with the medicine.

## 2017-03-10 NOTE — Progress Notes (Signed)
Pre visit review using our clinic review tool, if applicable. No additional management support is needed unless otherwise documented below in the visit note. 

## 2017-03-17 ENCOUNTER — Ambulatory Visit: Payer: Self-pay | Admitting: *Deleted

## 2017-03-17 MED ORDER — OXYCODONE HCL 5 MG PO TABS: 5.0000 mg | ORAL_TABLET | ORAL | 0 refills | Status: DC | PRN

## 2017-03-17 MED ORDER — MIRTAZAPINE 15 MG PO TABS: 15.0000 mg | ORAL_TABLET | Freq: Every day | ORAL | 2 refills | Status: DC

## 2017-03-17 NOTE — Telephone Encounter (Signed)
A stronger pain med has been called in as well as an alternative to Marinol.

## 2017-03-17 NOTE — Telephone Encounter (Signed)
I don't understand what is being routed or triaged to me?

## 2017-03-17 NOTE — Addendum Note (Signed)
Addended by: Radene GunningWENDLING, Jatavious Peppard P on: 03/17/2017 03:23 PM   Modules accepted: Orders

## 2017-03-17 NOTE — Telephone Encounter (Signed)
Patient was seen on the 4th and given Tramadol for pain---it is not working Also the marinol was $500 and they cannot afford that.  They would like an alternative for both the pain medication and marinol

## 2017-03-17 NOTE — Telephone Encounter (Signed)
Will route to [facility] for further disposition; pt previously seen by Dr Carmelia RollerWendling on 03/10/17.

## 2017-04-10 ENCOUNTER — Ambulatory Visit (INDEPENDENT_AMBULATORY_CARE_PROVIDER_SITE_OTHER): Payer: Medicare Other | Admitting: Family Medicine

## 2017-04-10 ENCOUNTER — Encounter: Payer: Self-pay | Admitting: Family Medicine

## 2017-04-10 VITALS — BP 100/52 | HR 75 | Temp 98.1°F | Resp 16

## 2017-04-10 DIAGNOSIS — R5381 Other malaise: Secondary | ICD-10-CM | POA: Diagnosis not present

## 2017-04-10 DIAGNOSIS — R109 Unspecified abdominal pain: Secondary | ICD-10-CM | POA: Diagnosis not present

## 2017-04-10 DIAGNOSIS — I824Y1 Acute embolism and thrombosis of unspecified deep veins of right proximal lower extremity: Secondary | ICD-10-CM

## 2017-04-10 DIAGNOSIS — R110 Nausea: Secondary | ICD-10-CM | POA: Diagnosis not present

## 2017-04-10 MED ORDER — AMLODIPINE BESYLATE 10 MG PO TABS: 5.0000 mg | ORAL_TABLET | Freq: Every day | ORAL | 3 refills | Status: DC

## 2017-04-10 MED ORDER — FENTANYL 25 MCG/HR TD PT72: 25.0000 ug | MEDICATED_PATCH | TRANSDERMAL | 0 refills | Status: DC

## 2017-04-10 MED ORDER — ONDANSETRON HCL 4 MG PO TABS: 4.0000 mg | ORAL_TABLET | Freq: Three times a day (TID) | ORAL | 0 refills | Status: AC | PRN

## 2017-04-10 NOTE — Progress Notes (Signed)
Chief Complaint  Patient presents with  . poor appetite    Here for follow up    Subjective: Patient is a 84 y.o. female here for med check.  She is here with her daughter and granddaughter.  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  She recently had a DVT and is experiencing pain and swelling in her right lower extremity.  She is not very mobile due to pain.  She is on Xarelto so taking anti-inflammatories and put her at high risk for bleeding.  She has been using Tylenol.  She was given tramadol at the initial appointment.  The pain medicine was tolerated well, however it was not helpful.  She was changed to oral oxycodone, however had side effects such as lightheadedness and nausea.  She has since stopped taking it.  She continues to have nausea and poor appetite.  She was prescribed Remeron, however has not been taking it.  She was initially prescribed Marinol but it was too expensive.   ROS: MSK: +R leg pain GI: +nausea  Family History  Problem Relation Age of Onset  . Diabetes Mother   . Kidney failure Mother   . Emphysema Father   . Stroke Maternal Grandmother   . Stroke Maternal Grandfather   . Emphysema Brother   . Asthma Sister   . Cancer Sister   . Hyperlipidemia Sister   . Heart attack Child    Past Medical History:  Diagnosis Date  . Bifascicular block   . Diastolic dysfunction   . Dyslipidemia   . Hypertension   . LVH (left ventricular hypertrophy)    No Known Allergies  Current Outpatient Medications:  .  amLODipine (NORVASC) 10 MG tablet, Take 0.5 tablets (5 mg total) by mouth daily., Disp: 90 tablet, Rfl: 3 .  diphenhydrAMINE (BENADRYL) 25 mg capsule, Take 50 mg by mouth at bedtime as needed for itching or allergies. , Disp: , Rfl:  .  feeding supplement, ENSURE ENLIVE, (ENSURE ENLIVE) LIQD, Take 237 mLs by mouth 2 (two) times daily between meals., Disp: 60  Bottle, Rfl: 0 .  metoprolol succinate (TOPROL-XL) 25 MG 24 hr tablet, TAKE 1 TABLET BY MOUTH EVERY DAY., Disp: 90 tablet, Rfl: 3 .  Multiple Vitamins-Minerals (CENTRUM SILVER ADULT 50+) TABS, Take 1 tablet by mouth daily., Disp: , Rfl:  .  ondansetron (ZOFRAN) 4 MG tablet, Take 1 tablet (4 mg total) by mouth every 8 (eight) hours as needed for nausea or vomiting., Disp: 20 tablet, Rfl: 0 .  rivaroxaban (XARELTO) 20 MG TABS tablet, Take 1 tablet (20 mg total) by mouth daily with supper., Disp: 30 tablet, Rfl: 1 .  fentaNYL (DURAGESIC - DOSED MCG/HR) 25 MCG/HR patch, Place 1 patch (25 mcg total) onto the skin every 3 (three) days., Disp: 5 patch, Rfl: 0  Objective: BP (!) 100/52 (BP Location: Left Arm, Patient Position: Sitting, Cuff Size: Small)   Pulse 75   Temp 98.1 F (36.7 C) (Oral)   Resp 16   SpO2 99%  General: Awake, appears stated age Lungs: No accessory muscle use Psych: Normal affect and mood  Assessment and Plan: Physical deconditioning - Plan: Ambulatory referral to Home Health  Nausea - Plan: ondansetron (ZOFRAN) 4 MG tablet  Abdominal pain, unspecified abdominal location - Plan: fentaNYL (DURAGESIC - DOSED MCG/HR) 25 MCG/HR patch  Deep vein thrombosis (DVT) of proximal vein of right lower extremity, unspecified chronicity (HCC) -  Plan: fentaNYL (DURAGESIC - DOSED MCG/HR) 25 MCG/HR patch, Ambulatory referral to Home Health  Orders as above.  We will start home health for possible physical therapy regarding physical deconditioning and lower extremity swelling. I view this is cancer related pain and end-of-life related care.  We will try a narcotic patch.  If this is not helpful, could consider hydrocodone or a continuous release formulation.  Alternatively, we could try methadone.  I did state that if at any time the family wishes to pursue a palliative care consult, I would be happy to do so. I would like her to take the Remeron.  At the very least, to see if it is helpful  for weight gain.  If this is not the case, could consider Megace. The patient, her daughter, and granddaughter voiced understanding and agreement to the plan.  Jilda Rocheicholas Paul NenzelWendling, DO 04/10/17  4:03 PM

## 2017-04-10 NOTE — Patient Instructions (Signed)
If you wish to see a palliative care specialist, let me know.  If you do not hear anything about your referral in the next several days, call our office and ask for an update.  Let us know if you need anything.

## 2017-04-14 ENCOUNTER — Telehealth: Payer: Self-pay

## 2017-04-14 NOTE — Telephone Encounter (Signed)
Copied from CRM [phone]#[id number]. Topic: Inquiry >> Apr 14, 2017 10:28 AM Maia Pettiesrtiz, Kristie S wrote: Reason for CRM:   Caller name: Marvis RepressLeezella Longsworth Relationship to patient: daughter Can be reached: [phone]  Reason for call: calling to find out if paperwork was received from [employer] regarding FMLA (for VictorLeezella caring for [place]) - pt sees Dr. Carmelia RollerWendling Claib[facility]- Leezella has copies if they have not received and can bring them to the office - deadline for return 04/21/17 - please advise

## 2017-04-14 NOTE — Telephone Encounter (Signed)
Checked with Jasmine DecemberSharon and we do have the paperwork Will be sometime next week before completed. Patient stated she needs by 04/21/17 [facility].

## 2017-04-14 NOTE — Telephone Encounter (Signed)
I have not seen anything. TY.  

## 2017-04-17 ENCOUNTER — Telehealth: Payer: Self-pay | Admitting: Family Medicine

## 2017-04-17 NOTE — Telephone Encounter (Signed)
Copied from CRM [phone]#[id number]. Topic: Quick Communication - See Telephone Encounter >> Apr 17, 2017 10:33 AM Arlyss Gandyichardson, Rohini Jaroszewski N, NT wrote: CRM for notification. See Telephone encounter for: Pts granddaughter, Zella BallRobin calling and states they need a referral for palliative care for her grandmother. Also her grandmother has been having trouble moving around and getting out of bed and would like an order for a hospital bed. CB#: [phone]  04/17/17.

## 2017-04-18 ENCOUNTER — Telehealth: Payer: Self-pay | Admitting: Family Medicine

## 2017-04-18 NOTE — Telephone Encounter (Signed)
OK. Please warn fam that the insurance company may give some resistance about paying for one. Please also place referral for palliative care for dx cachexia, abdominal mass, chronic pain. TY.

## 2017-04-18 NOTE — Telephone Encounter (Signed)
Please advise 

## 2017-04-18 NOTE — Telephone Encounter (Signed)
Copied from CRM [id number]. Topic: Quick Communication - See Telephone Encounter >> Apr 17, 2017 10:33 AM Arlyss Gandyichardson, Taren N, NT wrote: CRM for notification. See Telephone encounter for: Pts granddaughter, Zella BallRobin calling and states they need a referral for palliative care for her grandmother. Also her grandmother has been having trouble moving around and getting out of bed and would like an order for a hospital bed. CB#: [phone]  04/17/17. >> Apr 18, 2017  3:38 PM Rudi CocoLathan, Darell Saputo M, NT wrote: Pts granddaughter calling again  Zella BallRobin  and states they need a referral for palliative care for her grandmother. Also her grandmother has been having trouble moving around and getting out of bed and would like an order for a hospital bed. granddaughter  CB#:  [phone]

## 2017-04-19 ENCOUNTER — Other Ambulatory Visit: Payer: Self-pay | Admitting: Family Medicine

## 2017-04-19 DIAGNOSIS — G8929 Other chronic pain: Secondary | ICD-10-CM

## 2017-04-19 DIAGNOSIS — R64 Cachexia: Secondary | ICD-10-CM

## 2017-04-19 NOTE — Telephone Encounter (Signed)
Referral done/granddaughter informed

## 2017-04-19 NOTE — Telephone Encounter (Signed)
Completed as much as possible on paperwork from [employer]; forwarded to provider/SLS 02/13

## 2017-04-20 ENCOUNTER — Telehealth: Payer: Self-pay | Admitting: *Deleted

## 2017-04-20 NOTE — Telephone Encounter (Signed)
Copied from CRM [id number]. Topic: Inquiry >> Apr 14, 2017 10:28 AM Maia Pettiesrtiz, Kristie S wrote: Reason for CRM:   Caller name: Marvis RepressLeezella Longsworth Relationship to patient: daughter Can be reached: [phone]  Reason for call: calling to find out if paperwork was received from [employer] regarding FMLA (for Gloria Odom caring for Gloria Odom) - pt sees Dr. Carmelia RollerWendling Claiborne Billings- Gloria Odom has copies if they have not received and can bring them to the office - deadline for return 04/21/17 - please advise  >> Apr 18, 2017  3:17 PM Floria RavelingStovall, Shana A wrote: Pt called again to see if the FMLA papers have been completed ?  She is concerned because it has to be in by the 15th or she will lose her job

## 2017-04-20 NOTE — Telephone Encounter (Signed)
Copy & Paste from 04/14/17 phone note/SLS 02/14  Conversation  (Newest Message First)  April 20, 2017  Me    8:47 AM  Note    Paperwork faxed to [employer] at [phone] with confirmation/SLS 02/14    April 19, 2017  Me   9:56 AM  Note    Completed as much as possible on paperwork from [employer]; forwarded to provider/SLS 02/13    April 14, 2017   12:23 PM  Ewing, Vladimir Croftsobin B, CMA routed this conversation to Me  Ewing, Vladimir CroftsRobin B, CMA   12:21 PM  Note    Checked with Jasmine DecemberSharon and we do have the paperwork Will be sometime next week before completed. Patient stated she needs by 04/21/17 [facility].     11:59 AM  Sharlene DoryWendling, Nicholas Paul, DO routed this conversation to Ewing, Vladimir CroftsRobin B, CMA  Sharlene DoryWendling, Nicholas Paul, DO   11:58 AM  Note    I have not seen anything. TY.      11:45 AM  Linnell FullingBrannon, Latoya N, CMA routed this conversation to Sharlene DoryWendling, Nicholas Paul, DO  Linnell FullingBrannon, Latoya N, CMA   11:45 AM  Note    Copied from CRM [id number]. Topic: Inquiry >> Apr 14, 2017 10:28 AM Maia Pettiesrtiz, Kristie S wrote: Reason for CRM:   Caller name: Marvis RepressLeezella Longsworth Relationship to patient: daughter Can be reached: [phone]  Reason for call: calling to find out if paperwork was received from [employer] regarding FMLA (for Blue BallLeezella caring for HickoryLelia) - pt sees Dr. Carmelia RollerWendling Claiborne Billings- Leezella has copies if they have not received and can bring them to the office - deadline for return 04/21/17 - please advise

## 2017-04-20 NOTE — Telephone Encounter (Signed)
Paperwork faxed to [employer] at [phone] with confirmation/SLS 02/14

## 2017-04-21 ENCOUNTER — Telehealth: Payer: Self-pay | Admitting: Family Medicine

## 2017-04-21 NOTE — Telephone Encounter (Signed)
Copied from CRM [id number]. Topic: General - Other >> Apr 21, 2017 11:48 AM Windy KalataMichael, Seva Chancy L, NT wrote: Marylene LandAngela with [facility] and is requesting verbal nursing orders for 1x a week for 4 weeks.  [phone]

## 2017-04-21 NOTE — Telephone Encounter (Signed)
HHRN informed of verbal ok. 

## 2017-04-21 NOTE — Telephone Encounter (Signed)
HHRN informed of PCP ok. 

## 2017-04-21 NOTE — Telephone Encounter (Signed)
Copied from CRM [phone] [phone]. Topic: General - Other >> Apr 21, 2017 11:41 AM Cecelia ByarsGreen, Temeka L, RMA wrote: Reason for CRM: Clydie BraunKaren from [facility] requesting verbal orders for in home physician, please return call to Clydie BraunKaren at [phone]

## 2017-04-24 ENCOUNTER — Ambulatory Visit: Payer: Self-pay

## 2017-04-24 ENCOUNTER — Encounter (HOSPITAL_COMMUNITY): Payer: Self-pay | Admitting: Emergency Medicine

## 2017-04-24 ENCOUNTER — Inpatient Hospital Stay (HOSPITAL_COMMUNITY)
Admission: EM | Admit: 2017-04-24 | Discharge: 2017-04-27 | DRG: 871 | Disposition: A | Discharge status: Hospice | Payer: Medicare Other | Attending: Family Medicine | Admitting: Family Medicine

## 2017-04-24 ENCOUNTER — Emergency Department (HOSPITAL_COMMUNITY): Payer: Medicare Other

## 2017-04-24 DIAGNOSIS — E785 Hyperlipidemia, unspecified: Secondary | ICD-10-CM | POA: Diagnosis present

## 2017-04-24 DIAGNOSIS — D500 Iron deficiency anemia secondary to blood loss (chronic): Secondary | ICD-10-CM

## 2017-04-24 DIAGNOSIS — I509 Heart failure, unspecified: Secondary | ICD-10-CM | POA: Diagnosis present

## 2017-04-24 DIAGNOSIS — D72829 Elevated white blood cell count, unspecified: Secondary | ICD-10-CM

## 2017-04-24 DIAGNOSIS — Z8349 Family history of other endocrine, nutritional and metabolic diseases: Secondary | ICD-10-CM

## 2017-04-24 DIAGNOSIS — I130 Hypertensive heart and chronic kidney disease with heart failure and stage 1 through stage 4 chronic kidney disease, or unspecified chronic kidney disease: Secondary | ICD-10-CM | POA: Diagnosis present

## 2017-04-24 DIAGNOSIS — J189 Pneumonia, unspecified organism: Secondary | ICD-10-CM

## 2017-04-24 DIAGNOSIS — D649 Anemia, unspecified: Secondary | ICD-10-CM

## 2017-04-24 DIAGNOSIS — Z8249 Family history of ischemic heart disease and other diseases of the circulatory system: Secondary | ICD-10-CM

## 2017-04-24 DIAGNOSIS — Z841 Family history of disorders of kidney and ureter: Secondary | ICD-10-CM

## 2017-04-24 DIAGNOSIS — A419 Sepsis, unspecified organism: Secondary | ICD-10-CM | POA: Diagnosis not present

## 2017-04-24 DIAGNOSIS — E876 Hypokalemia: Secondary | ICD-10-CM | POA: Diagnosis present

## 2017-04-24 DIAGNOSIS — J181 Lobar pneumonia, unspecified organism: Secondary | ICD-10-CM

## 2017-04-24 DIAGNOSIS — J1008 Influenza due to other identified influenza virus with other specified pneumonia: Secondary | ICD-10-CM | POA: Diagnosis present

## 2017-04-24 DIAGNOSIS — E860 Dehydration: Secondary | ICD-10-CM | POA: Diagnosis not present

## 2017-04-24 DIAGNOSIS — R627 Adult failure to thrive: Secondary | ICD-10-CM | POA: Diagnosis present

## 2017-04-24 DIAGNOSIS — Z66 Do not resuscitate: Secondary | ICD-10-CM | POA: Diagnosis present

## 2017-04-24 DIAGNOSIS — Z515 Encounter for palliative care: Secondary | ICD-10-CM

## 2017-04-24 DIAGNOSIS — Z87891 Personal history of nicotine dependence: Secondary | ICD-10-CM

## 2017-04-24 DIAGNOSIS — N179 Acute kidney failure, unspecified: Secondary | ICD-10-CM | POA: Diagnosis present

## 2017-04-24 DIAGNOSIS — Z86718 Personal history of other venous thrombosis and embolism: Secondary | ICD-10-CM

## 2017-04-24 DIAGNOSIS — Z79899 Other long term (current) drug therapy: Secondary | ICD-10-CM

## 2017-04-24 DIAGNOSIS — R0902 Hypoxemia: Secondary | ICD-10-CM

## 2017-04-24 DIAGNOSIS — E46 Unspecified protein-calorie malnutrition: Secondary | ICD-10-CM

## 2017-04-24 DIAGNOSIS — N289 Disorder of kidney and ureter, unspecified: Secondary | ICD-10-CM

## 2017-04-24 DIAGNOSIS — J9601 Acute respiratory failure with hypoxia: Secondary | ICD-10-CM

## 2017-04-24 DIAGNOSIS — R1900 Intra-abdominal and pelvic swelling, mass and lump, unspecified site: Secondary | ICD-10-CM

## 2017-04-24 DIAGNOSIS — N184 Chronic kidney disease, stage 4 (severe): Secondary | ICD-10-CM | POA: Diagnosis present

## 2017-04-24 DIAGNOSIS — J101 Influenza due to other identified influenza virus with other respiratory manifestations: Secondary | ICD-10-CM

## 2017-04-24 DIAGNOSIS — I82409 Acute embolism and thrombosis of unspecified deep veins of unspecified lower extremity: Secondary | ICD-10-CM

## 2017-04-24 DIAGNOSIS — Z7901 Long term (current) use of anticoagulants: Secondary | ICD-10-CM

## 2017-04-24 DIAGNOSIS — Z7189 Other specified counseling: Secondary | ICD-10-CM

## 2017-04-24 DIAGNOSIS — I352 Nonrheumatic aortic (valve) stenosis with insufficiency: Secondary | ICD-10-CM | POA: Diagnosis present

## 2017-04-24 DIAGNOSIS — J90 Pleural effusion, not elsewhere classified: Secondary | ICD-10-CM

## 2017-04-24 LAB — CBC
HCT: 26.9 % — ABNORMAL LOW (ref 36.0–46.0)
HEMOGLOBIN: 8 g/dL — AB (ref 12.0–15.0)
MCH: 22.7 pg — AB (ref 26.0–34.0)
MCHC: 29.7 g/dL — ABNORMAL LOW (ref 30.0–36.0)
MCV: 76.4 fL — ABNORMAL LOW (ref 78.0–100.0)
PLATELETS: 464 10*3/uL — AB (ref 150–400)
RBC: 3.52 MIL/uL — ABNORMAL LOW (ref 3.87–5.11)
RDW: 21.6 % — ABNORMAL HIGH (ref 11.5–15.5)
WBC: 18.8 10*3/uL — AB (ref 4.0–10.5)

## 2017-04-24 LAB — BASIC METABOLIC PANEL
ANION GAP: 13 (ref 5–15)
BUN: 25 mg/dL — AB (ref 6–20)
CO2: 20 mmol/L — ABNORMAL LOW (ref 22–32)
Calcium: 9.2 mg/dL (ref 8.9–10.3)
Chloride: 105 mmol/L (ref 101–111)
Creatinine, Ser: 1.58 mg/dL — ABNORMAL HIGH (ref 0.44–1.00)
GFR, EST AFRICAN AMERICAN: 31 mL/min — AB (ref >60)
GFR, EST NON AFRICAN AMERICAN: 27 mL/min — AB (ref >60)
Glucose, Bld: 89 mg/dL (ref 65–99)
POTASSIUM: 3.1 mmol/L — AB (ref 3.5–5.1)
SODIUM: 138 mmol/L (ref 135–145)

## 2017-04-24 LAB — I-STAT CG4 LACTIC ACID, ED
LACTIC ACID, VENOUS: 3.1 mmol/L — AB (ref 0.5–1.9)
Lactic Acid, Venous: 1.73 mmol/L (ref 0.5–1.9)

## 2017-04-24 MED ORDER — SODIUM CHLORIDE 0.9 % IV SOLN
1.0000 g | Freq: Once | INTRAVENOUS | Status: AC
  Administered 2017-04-25: 1 g via INTRAVENOUS
  Filled 2017-04-24: qty 10

## 2017-04-24 MED ORDER — SODIUM CHLORIDE 0.9 % IV SOLN
500.0000 mg | Freq: Once | INTRAVENOUS | Status: AC
  Administered 2017-04-25: 500 mg via INTRAVENOUS
  Filled 2017-04-24: qty 500

## 2017-04-24 MED ORDER — SODIUM CHLORIDE 0.9 % IV BOLUS (SEPSIS)
1000.0000 mL | Freq: Once | INTRAVENOUS | Status: AC
  Administered 2017-04-25: 1000 mL via INTRAVENOUS

## 2017-04-24 NOTE — Telephone Encounter (Signed)
[facility].  Reported upon arrival pt. was asleep in recumbant position, and 02 Sat. Was 77 %.  Stated after she sat up for a little while, her 02 sat. Increased to 94-97%.  Denied  labored breathing at rest.   Reported the family stated she has had a cold for about one week, and has been SOB since Friday.  PT reported noting wheezing in all lobes of lungs upon auscultation.  Family reported the pt. has been very weak. Reported inability to tolerate activity due to increased shortness of breath and weakness.  PT. reported that there has been increased confusion and hallucinations noted by family, with pt. being aroused from sleep, or during sleep. Other VS: T. 97.4, P. 76, R. 15-16, BP. 110/70.    Reported her 02 Sat. 88-90 % on 2/15, as noted by previous therapist.  Advised with shortness of breath, wheezing in all lung fields and low 02 sat of 77 %, it is recommended to have pt. evaluated in the ER.  Spoke with pt's daughter.  She verb. Understanding and agreed with plan.          Reason for Disposition . [1] MODERATE difficulty breathing (e.g., speaks in phrases, SOB even at rest, pulse 100-120) AND [2] NEW-onset or WORSE than normal  Answer Assessment - Initial Assessment Questions 1. RESPIRATORY STATUS: "Describe your breathing?" (e.g., wheezing, shortness of breath, unable to speak, severe coughing)      Wheezing with auscultation.; breathing is normal 2. ONSET: "When did this breathing problem begin?"      Reported some difficulty breathing this morning with doing ADL's 3. PATTERN "Does the difficult breathing come and go, or has it been constant since it started?"     Shortness of breath with activity 4. SEVERITY: "How bad is your breathing?" (e.g., mild, moderate, severe)    - MILD: No SOB at rest, mild SOB with walking, speaks normally in sentences, can lay down, no retractions, pulse < 100.    - MODERATE: SOB at rest, SOB with minimal exertion and prefers to sit,  cannot lie down flat, speaks in phrases, mild retractions, audible wheezing, pulse 100-120.    - SEVERE: Very SOB at rest, speaks in single words, struggling to breathe, sitting hunched forward, retractions, pulse > 120      Severe with activity 5. RECURRENT SYMPTOM: "Have you had difficulty breathing before?" If so, ask: "When was the last time?" and "What happened that time?"      Intermittent shortness of breath 6. CARDIAC HISTORY: "Do you have any history of heart disease?" (e.g., heart attack, angina, bypass surgery, angioplasty)     HTN 7. LUNG HISTORY: "Do you have any history of lung disease?"  (e.g., pulmonary embolus, asthma, emphysema)     No COPD; no asthma, no PE 8. CAUSE: "What do you think is causing the breathing problem?"      Cold symptoms x one week; using Corcidin HBP 9. OTHER SYMPTOMS: "Do you have any other symptoms? (e.g., dizziness, runny nose, cough, chest pain, fever)     Nasal drainage and coughing up clear, white sputum; denied chest pain   10. PREGNANCY: "Is there any chance you are pregnant?" "When was your last menstrual period?"       No  11. TRAVEL: "Have you traveled out of the country in the last month?" (e.g., travel history, exposures)       No  Protocols used: BREATHING DIFFICULTY-A-AH

## 2017-04-24 NOTE — ED Provider Notes (Signed)
[facility] EMERGENCY DEPARTMENT Provider Note   CSN: [id number] Arrival date & time: 04/24/17  1816  Time seen 23:05 AM   History   Chief Complaint Chief Complaint  Patient presents with  . Cough  . Low O2 Sat   Level 5 caveat for age  HPI Gloria Odom is a 84 y.o. female.  HPI patient presents with her daughter and granddaughter with whom she lives.  They state that she has had a cough for the past week and has been complaining of feeling short of breath for the past 3 days.  She is coughing up white sputum.  They deny any fever.  She denies chest pain.  Her physical therapist came out today and states her oxygen was 77% and sent her to the ED.  She is not on oxygen at home.  She does not have a history of lung disease.  She does not smoke.  Her last admission to the hospital was in May 2018.  They describe loss of appetite over the past month and especially the last couple days.  They report she had a history of kidney injury from dehydration when she was admitted in May.  Family states that she was diagnosed with DVT in her right leg in December, her ED visit was December 16 and she is currently on Xarelto.  They also states she has a history of aortic stenosis and is followed by Dr. Rennis Golden.  She also was recently diagnosed with a mass in her abdomen which she has refused treatment.  Looking at her chart it appears she has a left ovarian mass.  PCP Sharlene Dory, DO with [facility]   Past Medical History:  Diagnosis Date  . Bifascicular block   . Diastolic dysfunction   . Dyslipidemia   . Hypertension   . LVH (left ventricular hypertrophy)     Patient Active Problem List   Diagnosis Date Noted  . Pneumonia 04/25/2017  . Hypokalemia 04/24/2017  . CAP (community acquired pneumonia) 04/24/2017  . Leukocytosis 04/24/2017  . Renal insufficiency 04/24/2017  . Encounter for palliative care 12/14/2016  . Abdominal pain 11/29/2016  . Weight loss  11/29/2016  . History of uterine fibroid 11/29/2016  . Ovarian mass, left 11/29/2016  . CKD (chronic kidney disease) stage 4, GFR 15-29 ml/min (HCC) 08/23/2016  . AKI (acute kidney injury) (HCC) 08/23/2016  . Acute renal failure (ARF) (HCC) 08/04/2016  . Anemia 08/04/2016  . Vomiting 08/03/2016  . Loss of appetite 08/03/2016  . Chills 08/03/2016  . Aortic valve stenosis with insufficiency 06/16/2015  . Physical deconditioning 05/10/2015  . HTN (hypertension) 06/12/2013  . Dyslipidemia 06/12/2013  . PAC (premature atrial contraction) 06/12/2013    Past Surgical History:  Procedure Laterality Date  . TRANSTHORACIC ECHOCARDIOGRAM  2007   EF normal; mild MR & TR; mild AV regurg & AV moderately sclerotic    OB History    Gravida Para Term Preterm AB Living   2 2 2     2    SAB TAB Ectopic Multiple Live Births                   Home Medications    Prior to Admission medications   Medication Sig Start Date End Date Taking? Authorizing Provider  amLODipine (NORVASC) 10 MG tablet Take 0.5 tablets (5 mg total) by mouth daily. 04/10/17   Sharlene Dory, DO  diphenhydrAMINE (BENADRYL) 25 mg capsule Take 50 mg by mouth at bedtime as  needed for itching or allergies.     [provider]  feeding supplement, ENSURE ENLIVE, (ENSURE ENLIVE) LIQD Take 237 mLs by mouth 2 (two) times daily between meals. 08/06/16   Mikhail, Nita Sells, DO  fentaNYL (DURAGESIC - DOSED MCG/HR) 25 MCG/HR patch Place 1 patch (25 mcg total) onto the skin every 3 (three) days. 04/10/17   Sharlene Dory, DO  metoprolol succinate (TOPROL-XL) 25 MG 24 hr tablet TAKE 1 TABLET BY MOUTH EVERY DAY. 08/12/16   Hilty, Lisette Abu, MD  Multiple Vitamins-Minerals (CENTRUM SILVER ADULT 50+) TABS Take 1 tablet by mouth daily.    [provider]  ondansetron (ZOFRAN) 4 MG tablet Take 1 tablet (4 mg total) by mouth every 8 (eight) hours as needed for nausea or vomiting. 04/10/17   Sharlene Dory, DO    rivaroxaban (XARELTO) 20 MG TABS tablet Take 1 tablet (20 mg total) by mouth daily with supper. 03/21/17 05/20/17  Sharlene Dory, DO    Family History Family History  Problem Relation Age of Onset  . Diabetes Mother   . Kidney failure Mother   . Emphysema Father   . Stroke Maternal Grandmother   . Stroke Maternal Grandfather   . Emphysema Brother   . Asthma Sister   . Cancer Sister   . Hyperlipidemia Sister   . Heart attack Child     Social History Social History   Tobacco Use  . Smoking status: Former Games developer  . Smokeless tobacco: Never Used  Substance Use Topics  . Alcohol use: No  . Drug use: No  lives at home  Lives with family   Allergies   Patient has no known allergies.   Review of Systems Review of Systems  All other systems reviewed and are negative.    Physical Exam Updated Vital Signs BP (!) 100/50 (BP Location: Right Arm)   Pulse 83   Temp 97.9 F (36.6 C) (Oral)   Resp 16   Ht 5\' 6"  (1.676 m)   Wt 59.4 kg (131 lb)   SpO2 96%   BMI 21.14 kg/m   Vital signs normal except borderline hypotension   Physical Exam  Constitutional: She is oriented to person, place, and time.  Non-toxic appearance. She does not appear ill. No distress.  Thin elderly female  HENT:  Head: Normocephalic and atraumatic.  Right Ear: External ear normal.  Left Ear: External ear normal.  Nose: Nose normal. No mucosal edema or rhinorrhea.  Mouth/Throat: Oropharynx is clear and moist and mucous membranes are normal. No dental abscesses or uvula swelling.  Eyes: Conjunctivae and EOM are normal. Pupils are equal, round, and reactive to light.  Neck: Normal range of motion and full passive range of motion without pain. Neck supple.  Cardiovascular: Normal rate and regular rhythm. Exam reveals no gallop and no friction rub.  Murmur heard. Faint harsh systolic murmer  Pulmonary/Chest: Effort normal and breath sounds normal. No respiratory distress. She has no  wheezes. She has no rhonchi. She has no rales. She exhibits no tenderness and no crepitus.  Pt has diffuse rhonchi, denies ever using an inhaler or nebulizer in the past  Abdominal: Soft. Normal appearance and bowel sounds are normal. She exhibits no distension. There is no tenderness. There is no rebound and no guarding.  Musculoskeletal: Normal range of motion. She exhibits edema. She exhibits no tenderness.  Has diffuse swelling of both her feet and swelling of her right leg especially around her knee   Neurological: She  is alert and oriented to person, place, and time. She has normal strength. No cranial nerve deficit.  Skin: Skin is warm, dry and intact. No rash noted. No erythema. No pallor.  Psychiatric: She has a normal mood and affect. Her speech is normal and behavior is normal. Her mood appears not anxious.  Nursing note and vitals reviewed.    ED Treatments / Results  Labs (all labs ordered are listed, but only abnormal results are displayed) Results for orders placed or performed during the hospital encounter of 04/24/17  CBC  Result Value Ref Range   WBC 18.8 (H) 4.0 - 10.5 K/uL   RBC 3.52 (L) 3.87 - 5.11 MIL/uL   Hemoglobin 8.0 (L) 12.0 - 15.0 g/dL   HCT 16.1 (L) 09.6 - 04.5 %   MCV 76.4 (L) 78.0 - 100.0 fL   MCH 22.7 (L) 26.0 - 34.0 pg   MCHC 29.7 (L) 30.0 - 36.0 g/dL   RDW 40.9 (H) 81.1 - 91.4 %   Platelets 464 (H) 150 - 400 K/uL  Basic metabolic panel  Result Value Ref Range   Sodium 138 135 - 145 mmol/L   Potassium 3.1 (L) 3.5 - 5.1 mmol/L   Chloride 105 101 - 111 mmol/L   CO2 20 (L) 22 - 32 mmol/L   Glucose, Bld 89 65 - 99 mg/dL   BUN 25 (H) 6 - 20 mg/dL   Creatinine, Ser 7.82 (H) 0.44 - 1.00 mg/dL   Calcium 9.2 8.9 - 95.6 mg/dL   GFR calc non Af Amer 27 (L) >60 mL/min   GFR calc Af Amer 31 (L) >60 mL/min   Anion gap 13 5 - 15  I-Stat CG4 Lactic Acid, ED  Result Value Ref Range   Lactic Acid, Venous 3.10 (HH) 0.5 - 1.9 mmol/L   Comment NOTIFIED PHYSICIAN    I-Stat CG4 Lactic Acid, ED  Result Value Ref Range   Lactic Acid, Venous 1.73 0.5 - 1.9 mmol/L   Laboratory interpretation all normal except new renal insufficiency, stable anemia, increased of her baseline leukocytosis, microcytic anemia    EKG  EKG Interpretation None       Radiology Dg Chest 2 View  Result Date: 04/24/2017 CLINICAL DATA:  Per pt family, they reported a SpO2 reading of 77% EXAM: CHEST  2 VIEW COMPARISON:  02/19/2017 FINDINGS: The heart is enlarged. There is perihilar peribronchial thickening. Left pleural effusion is associated with left basilar opacity obscuring the anterior portion of the hemidiaphragm. No pulmonary edema. IMPRESSION: Left lower lobe opacity and pleural effusion, most compatible with infectious infiltrate. Followup PA and lateral chest X-ray is recommended in 3-4 weeks following trial of antibiotic therapy to ensure resolution and exclude underlying malignancy. Bronchitic changes. Electronically Signed   By: Norva Pavlov M.D.   On: 04/24/2017 20:12    Procedures Procedures (including critical care time)  Medications Ordered in ED Medications  cefTRIAXone (ROCEPHIN) 1 g in sodium chloride 0.9 % 100 mL IVPB (not administered)  azithromycin (ZITHROMAX) 500 mg in sodium chloride 0.9 % 250 mL IVPB (not administered)  sodium chloride 0.9 % bolus 1,000 mL (not administered)     Initial Impression / Assessment and Plan / ED Course  I have reviewed the triage vital signs and the nursing notes.  Pertinent labs & imaging results that were available during my care of the patient were reviewed by me and considered in my medical decision making (see chart for details).     During my interview we  were finally able to get the pulse ox monitor to work.  Her initial one was 72%, and then it did improve to 87%.  She was placed on nasal cannula oxygen.  Since patient has not been admitted to the hospital in the past 3 months she was started on  community-acquired pneumonia antibiotics.  I reviewed her x-ray with the family and there is a distinct difference from her chest x-ray in December with the pleural effusion which appears to be moderate on the left.  Rectal temperature was obtained because patient started complaining of feeling cold.  I am going to discuss patient with the hospitalist about admission.  The elevation of her white count is consistent with a pneumonia.  Also of concern would be if she had a PE with her recent history of DVT however that was almost 2 months ago and she has been on Xarelto.  Also concern was congestive heart failure so BNP was ordered, she does have edema of both feet and the effusion and a history of aortic stenosis.  Consideration was made for Lasix however patient has a new acute renal insufficiency and appears to be dehydrated.  I think that decision can be made by the hospitalist when the BMP returns.   23:45 PM if Dr. Selena BattenKim, hospitalist will admit.  Final Clinical Impressions(s) / ED Diagnoses   Final diagnoses:  Dehydration  Acute renal insufficiency  Anemia, unspecified type  Community acquired pneumonia of left lower lobe of lung (HCC)  Pleural effusion on left  Hypoxia    Plan admission  Devoria AlbeIva Krue Peterka, MD, Concha PyoFACEP    Allyana Vogan, MD 04/25/17 [phone]

## 2017-04-24 NOTE — ED Triage Notes (Signed)
Pt suggested by home physical therapist to come to ED for further eval of low oxygen saturation. Per pt family, they reported a SpO2 reading of 77% RA. Pt 95-100% RA in triage, pt denies SOB or CP. Patient has had productive cough and congestion x 1 week. No fevers. Resp e/u, skin warm/dry. Pt hypotensive in triage.

## 2017-04-24 NOTE — ED Notes (Signed)
ED Provider at bedside. 

## 2017-04-25 ENCOUNTER — Other Ambulatory Visit: Payer: Self-pay

## 2017-04-25 ENCOUNTER — Telehealth: Payer: Self-pay | Admitting: *Deleted

## 2017-04-25 DIAGNOSIS — R1900 Intra-abdominal and pelvic swelling, mass and lump, unspecified site: Secondary | ICD-10-CM | POA: Diagnosis present

## 2017-04-25 DIAGNOSIS — E860 Dehydration: Secondary | ICD-10-CM | POA: Diagnosis present

## 2017-04-25 DIAGNOSIS — N289 Disorder of kidney and ureter, unspecified: Secondary | ICD-10-CM

## 2017-04-25 DIAGNOSIS — I509 Heart failure, unspecified: Secondary | ICD-10-CM | POA: Diagnosis present

## 2017-04-25 DIAGNOSIS — R0902 Hypoxemia: Secondary | ICD-10-CM | POA: Diagnosis not present

## 2017-04-25 DIAGNOSIS — N179 Acute kidney failure, unspecified: Secondary | ICD-10-CM | POA: Diagnosis present

## 2017-04-25 DIAGNOSIS — Z66 Do not resuscitate: Secondary | ICD-10-CM | POA: Diagnosis present

## 2017-04-25 DIAGNOSIS — E46 Unspecified protein-calorie malnutrition: Secondary | ICD-10-CM | POA: Diagnosis not present

## 2017-04-25 DIAGNOSIS — Z8349 Family history of other endocrine, nutritional and metabolic diseases: Secondary | ICD-10-CM | POA: Diagnosis not present

## 2017-04-25 DIAGNOSIS — Z7901 Long term (current) use of anticoagulants: Secondary | ICD-10-CM | POA: Diagnosis not present

## 2017-04-25 DIAGNOSIS — E785 Hyperlipidemia, unspecified: Secondary | ICD-10-CM | POA: Diagnosis present

## 2017-04-25 DIAGNOSIS — D649 Anemia, unspecified: Secondary | ICD-10-CM | POA: Diagnosis not present

## 2017-04-25 DIAGNOSIS — D72829 Elevated white blood cell count, unspecified: Secondary | ICD-10-CM

## 2017-04-25 DIAGNOSIS — J189 Pneumonia, unspecified organism: Secondary | ICD-10-CM | POA: Diagnosis not present

## 2017-04-25 DIAGNOSIS — N184 Chronic kidney disease, stage 4 (severe): Secondary | ICD-10-CM | POA: Diagnosis present

## 2017-04-25 DIAGNOSIS — Z841 Family history of disorders of kidney and ureter: Secondary | ICD-10-CM | POA: Diagnosis not present

## 2017-04-25 DIAGNOSIS — Z87891 Personal history of nicotine dependence: Secondary | ICD-10-CM | POA: Diagnosis not present

## 2017-04-25 DIAGNOSIS — Z86718 Personal history of other venous thrombosis and embolism: Secondary | ICD-10-CM | POA: Diagnosis not present

## 2017-04-25 DIAGNOSIS — I130 Hypertensive heart and chronic kidney disease with heart failure and stage 1 through stage 4 chronic kidney disease, or unspecified chronic kidney disease: Secondary | ICD-10-CM | POA: Diagnosis present

## 2017-04-25 DIAGNOSIS — R627 Adult failure to thrive: Secondary | ICD-10-CM | POA: Diagnosis present

## 2017-04-25 DIAGNOSIS — Z515 Encounter for palliative care: Secondary | ICD-10-CM | POA: Diagnosis not present

## 2017-04-25 DIAGNOSIS — E876 Hypokalemia: Secondary | ICD-10-CM

## 2017-04-25 DIAGNOSIS — Z8249 Family history of ischemic heart disease and other diseases of the circulatory system: Secondary | ICD-10-CM | POA: Diagnosis not present

## 2017-04-25 DIAGNOSIS — D500 Iron deficiency anemia secondary to blood loss (chronic): Secondary | ICD-10-CM | POA: Diagnosis present

## 2017-04-25 DIAGNOSIS — Z79899 Other long term (current) drug therapy: Secondary | ICD-10-CM | POA: Diagnosis not present

## 2017-04-25 DIAGNOSIS — J1008 Influenza due to other identified influenza virus with other specified pneumonia: Secondary | ICD-10-CM | POA: Diagnosis present

## 2017-04-25 DIAGNOSIS — J9601 Acute respiratory failure with hypoxia: Secondary | ICD-10-CM | POA: Diagnosis present

## 2017-04-25 DIAGNOSIS — J181 Lobar pneumonia, unspecified organism: Secondary | ICD-10-CM | POA: Diagnosis present

## 2017-04-25 DIAGNOSIS — A419 Sepsis, unspecified organism: Secondary | ICD-10-CM | POA: Diagnosis present

## 2017-04-25 DIAGNOSIS — I352 Nonrheumatic aortic (valve) stenosis with insufficiency: Secondary | ICD-10-CM | POA: Diagnosis present

## 2017-04-25 DIAGNOSIS — J101 Influenza due to other identified influenza virus with other respiratory manifestations: Secondary | ICD-10-CM | POA: Diagnosis not present

## 2017-04-25 LAB — COMPREHENSIVE METABOLIC PANEL
ALK PHOS: 66 U/L (ref 38–126)
ALT: 13 U/L — AB (ref 14–54)
AST: 21 U/L (ref 15–41)
Albumin: 2.2 g/dL — ABNORMAL LOW (ref 3.5–5.0)
Anion gap: 14 (ref 5–15)
BUN: 25 mg/dL — ABNORMAL HIGH (ref 6–20)
CALCIUM: 8.9 mg/dL (ref 8.9–10.3)
CO2: 19 mmol/L — ABNORMAL LOW (ref 22–32)
CREATININE: 1.45 mg/dL — AB (ref 0.44–1.00)
Chloride: 107 mmol/L (ref 101–111)
GFR, EST AFRICAN AMERICAN: 35 mL/min — AB (ref >60)
GFR, EST NON AFRICAN AMERICAN: 30 mL/min — AB (ref >60)
Glucose, Bld: 86 mg/dL (ref 65–99)
Potassium: 3.1 mmol/L — ABNORMAL LOW (ref 3.5–5.1)
Sodium: 140 mmol/L (ref 135–145)
Total Bilirubin: 0.6 mg/dL (ref 0.3–1.2)
Total Protein: 6.2 g/dL — ABNORMAL LOW (ref 6.5–8.1)

## 2017-04-25 LAB — RESPIRATORY PANEL BY PCR
Adenovirus: NOT DETECTED
Bordetella pertussis: NOT DETECTED
CHLAMYDOPHILA PNEUMONIAE-RVPPCR: NOT DETECTED
CORONAVIRUS HKU1-RVPPCR: NOT DETECTED
CORONAVIRUS OC43-RVPPCR: NOT DETECTED
Coronavirus 229E: NOT DETECTED
Coronavirus NL63: NOT DETECTED
Influenza A: UNDETERMINED — AB
Influenza B: NOT DETECTED
MYCOPLASMA PNEUMONIAE-RVPPCR: NOT DETECTED
Metapneumovirus: NOT DETECTED
PARAINFLUENZA VIRUS 1-RVPPCR: NOT DETECTED
PARAINFLUENZA VIRUS 3-RVPPCR: NOT DETECTED
PARAINFLUENZA VIRUS 4-RVPPCR: NOT DETECTED
Parainfluenza Virus 2: NOT DETECTED
RHINOVIRUS / ENTEROVIRUS - RVPPCR: NOT DETECTED
Respiratory Syncytial Virus: NOT DETECTED

## 2017-04-25 LAB — CBC WITH DIFFERENTIAL/PLATELET
BASOS PCT: 0 %
Basophils Absolute: 0 10*3/uL (ref 0.0–0.1)
EOS PCT: 0 %
Eosinophils Absolute: 0 10*3/uL (ref 0.0–0.7)
HCT: 26 % — ABNORMAL LOW (ref 36.0–46.0)
HEMOGLOBIN: 7.7 g/dL — AB (ref 12.0–15.0)
LYMPHS PCT: 5 %
Lymphs Abs: 1.2 10*3/uL (ref 0.7–4.0)
MCH: 22.8 pg — AB (ref 26.0–34.0)
MCHC: 29.6 g/dL — AB (ref 30.0–36.0)
MCV: 76.9 fL — AB (ref 78.0–100.0)
Monocytes Absolute: 0.7 10*3/uL (ref 0.1–1.0)
Monocytes Relative: 3 %
NEUTROS ABS: 21.1 10*3/uL — AB (ref 1.7–7.7)
NEUTROS PCT: 92 %
Platelets: 370 10*3/uL (ref 150–400)
RBC: 3.38 MIL/uL — ABNORMAL LOW (ref 3.87–5.11)
RDW: 21.6 % — ABNORMAL HIGH (ref 11.5–15.5)
WBC: 23 10*3/uL — ABNORMAL HIGH (ref 4.0–10.5)

## 2017-04-25 LAB — STREP PNEUMONIAE URINARY ANTIGEN: Strep Pneumo Urinary Antigen: NEGATIVE

## 2017-04-25 LAB — HIV ANTIBODY (ROUTINE TESTING W REFLEX): HIV SCREEN 4TH GENERATION: NONREACTIVE

## 2017-04-25 LAB — INFLUENZA PANEL BY PCR (TYPE A & B)
INFLBPCR: NEGATIVE
Influenza A By PCR: POSITIVE — AB

## 2017-04-25 LAB — MAGNESIUM: Magnesium: 1.8 mg/dL (ref 1.7–2.4)

## 2017-04-25 LAB — BRAIN NATRIURETIC PEPTIDE: B NATRIURETIC PEPTIDE 5: 2710.4 pg/mL — AB (ref 0.0–100.0)

## 2017-04-25 MED ORDER — RIVAROXABAN 20 MG PO TABS
20.0000 mg | ORAL_TABLET | Freq: Every day | ORAL | Status: DC
  Administered 2017-04-25 – 2017-04-26 (×2): 20 mg via ORAL
  Filled 2017-04-25 (×4): qty 1

## 2017-04-25 MED ORDER — SODIUM CHLORIDE 0.9 % IV SOLN
500.0000 mg | INTRAVENOUS | Status: DC
  Administered 2017-04-26 – 2017-04-27 (×2): 500 mg via INTRAVENOUS
  Filled 2017-04-25 (×3): qty 500

## 2017-04-25 MED ORDER — FENTANYL 12 MCG/HR TD PT72: 25.0000 ug | MEDICATED_PATCH | TRANSDERMAL | Status: DC

## 2017-04-25 MED ORDER — ACETAMINOPHEN 325 MG PO TABS
650.0000 mg | ORAL_TABLET | Freq: Four times a day (QID) | ORAL | Status: DC | PRN
  Administered 2017-04-25: 650 mg via ORAL
  Filled 2017-04-25: qty 2

## 2017-04-25 MED ORDER — SODIUM CHLORIDE 0.9 % IV SOLN
1.0000 g | INTRAVENOUS | Status: DC
  Administered 2017-04-25 – 2017-04-26 (×2): 1 g via INTRAVENOUS
  Filled 2017-04-25 (×3): qty 10

## 2017-04-25 MED ORDER — AMLODIPINE BESYLATE 5 MG PO TABS
5.0000 mg | ORAL_TABLET | Freq: Every day | ORAL | Status: DC
  Administered 2017-04-26: 5 mg via ORAL
  Filled 2017-04-25 (×3): qty 1

## 2017-04-25 MED ORDER — ONDANSETRON HCL 4 MG PO TABS: 4.0000 mg | ORAL_TABLET | Freq: Three times a day (TID) | ORAL | Status: DC | PRN

## 2017-04-25 MED ORDER — SODIUM CHLORIDE 0.9 % IV SOLN
1.0000 g | INTRAVENOUS | Status: DC
  Filled 2017-04-25: qty 10

## 2017-04-25 MED ORDER — SODIUM CHLORIDE 0.9 % IV SOLN
INTRAVENOUS | Status: DC
  Administered 2017-04-25 – 2017-04-27 (×4): via INTRAVENOUS

## 2017-04-25 MED ORDER — SODIUM CHLORIDE 0.9 % IV SOLN
500.0000 mg | INTRAVENOUS | Status: DC
  Filled 2017-04-25: qty 500

## 2017-04-25 MED ORDER — POTASSIUM CHLORIDE CRYS ER 20 MEQ PO TBCR
30.0000 meq | EXTENDED_RELEASE_TABLET | Freq: Once | ORAL | Status: AC
  Administered 2017-04-25: 12:00:00 30 meq via ORAL
  Filled 2017-04-25: qty 1

## 2017-04-25 MED ORDER — ENSURE ENLIVE PO LIQD
237.0000 mL | Freq: Two times a day (BID) | ORAL | Status: DC
  Administered 2017-04-25 – 2017-04-27 (×4): 237 mL via ORAL
  Filled 2017-04-25: qty 237

## 2017-04-25 MED ORDER — OSELTAMIVIR PHOSPHATE 30 MG PO CAPS
30.0000 mg | ORAL_CAPSULE | Freq: Every day | ORAL | Status: DC
  Administered 2017-04-25 – 2017-04-27 (×3): 30 mg via ORAL
  Filled 2017-04-25 (×3): qty 1

## 2017-04-25 MED ORDER — DIPHENHYDRAMINE HCL 25 MG PO CAPS: 50.0000 mg | ORAL_CAPSULE | Freq: Every evening | ORAL | Status: DC | PRN

## 2017-04-25 MED ORDER — METOPROLOL SUCCINATE ER 25 MG PO TB24
25.0000 mg | ORAL_TABLET | Freq: Every day | ORAL | Status: DC
  Administered 2017-04-26: 25 mg via ORAL
  Filled 2017-04-25 (×3): qty 1

## 2017-04-25 NOTE — H&P (Addendum)
TRH H&P   Patient Demographics:    Gloria Odom, is a 84 y.o. female  MRN: [id number]   DOB - Nov 05, 1920  Admit Date - 04/24/2017  Outpatient Primary MD for the patient is Carmelia Roller, Jilda Roche, DO  Referring MD/NP/PA: Devoria Albe  Outpatient Specialists:  Patient coming from: home  Chief Complaint  Patient presents with  . Cough  . Low O2 Sat      HPI:    Gloria Odom  is a 84 y.o. female, w hypertension, hyperlipidemia, apparently was found to be hypoxic 77% at home by physical therapy.  Pt was sent to ED by PT.  Pt is not on home o2. Pt notes slight cough, white sputum.  Slight dyspnea.  Denies fever, chills, cp, palp, n/v, diarrhea, brbpr, black stool.  Pt presented to ED for evaluation of hypoxia.    In Ed,   CXR  IMPRESSION: Left lower lobe opacity and pleural effusion, most compatible with infectious infiltrate. Followup PA and lateral chest X-ray is recommended in 3-4 weeks following trial of antibiotic therapy to ensure resolution and exclude underlying malignancy.  Bronchitic changes.  Wbc 18.8, Hgb 8.0, Plt 464 Na 138, K 3.1,  Bun 25, Creatinine 1.58 Hco3=20  Lactic acid 3.10  Blood culture x2 pending  Pt will be admitted for left lower lung pneumonia, anemia, hypokalemia.      Review of systems:    In addition to the HPI above,  No Fever-chills, No Headache, No changes with Vision or hearing, No problems swallowing food or Liquids, No Chest pain No Abdominal pain, No Nausea or Vommitting, Bowel movements are regular, No Blood in stool or Urine, No dysuria, No new skin rashes or bruises, No new joints pains-aches,  No new weakness, tingling, numbness in any extremity, No recent weight gain or loss, No polyuria, polydypsia or polyphagia, No significant Mental Stressors.  A full 10 point Review of Systems was done, except as stated  above, all other Review of Systems were negative.   With Past History of the following :    Past Medical History:  Diagnosis Date  . Bifascicular block   . Diastolic dysfunction   . Dyslipidemia   . Hypertension   . LVH (left ventricular hypertrophy)       Past Surgical History:  Procedure Laterality Date  . TRANSTHORACIC ECHOCARDIOGRAM  2007   EF normal; mild MR & TR; mild AV regurg & AV moderately sclerotic      Social History:     Social History   Tobacco Use  . Smoking status: Former Games developer  . Smokeless tobacco: Never Used  Substance Use Topics  . Alcohol use: No     Lives - at home  Mobility - walks w assistance   Family History :     Family History  Problem Relation Age of Onset  . Diabetes Mother   .  Kidney failure Mother   . Emphysema Father   . Stroke Maternal Grandmother   . Stroke Maternal Grandfather   . Emphysema Brother   . Asthma Sister   . Cancer Sister   . Hyperlipidemia Sister   . Heart attack Child       Home Medications:   Prior to Admission medications   Medication Sig Start Date End Date Taking? Authorizing Provider  amLODipine (NORVASC) 10 MG tablet Take 0.5 tablets (5 mg total) by mouth daily. 04/10/17   Sharlene Dory, DO  diphenhydrAMINE (BENADRYL) 25 mg capsule Take 50 mg by mouth at bedtime as needed for itching or allergies.     [provider]  feeding supplement, ENSURE ENLIVE, (ENSURE ENLIVE) LIQD Take 237 mLs by mouth 2 (two) times daily between meals. 08/06/16   Mikhail, Nita Sells, DO  fentaNYL (DURAGESIC - DOSED MCG/HR) 25 MCG/HR patch Place 1 patch (25 mcg total) onto the skin every 3 (three) days. 04/10/17   Sharlene Dory, DO  metoprolol succinate (TOPROL-XL) 25 MG 24 hr tablet TAKE 1 TABLET BY MOUTH EVERY DAY. 08/12/16   Hilty, Lisette Abu, MD  Multiple Vitamins-Minerals (CENTRUM SILVER ADULT 50+) TABS Take 1 tablet by mouth daily.    [provider]  ondansetron (ZOFRAN) 4 MG tablet Take  1 tablet (4 mg total) by mouth every 8 (eight) hours as needed for nausea or vomiting. 04/10/17   Sharlene Dory, DO  rivaroxaban (XARELTO) 20 MG TABS tablet Take 1 tablet (20 mg total) by mouth daily with supper. 03/21/17 05/20/17  Sharlene Dory, DO     Allergies:    No Known Allergies   Physical Exam:   Vitals  Blood pressure 90/72, pulse 61, temperature 97.9 F (36.6 C), temperature source Oral, resp. rate 18, height 5\' 6"  (1.676 m), weight 59.4 kg (131 lb), SpO2 96 %.   1. General  lying in bed in NAD,   2. Normal affect and insight, Not Suicidal or Homicidal, Awake Alert, Oriented X 3.  3. No F.N deficits, ALL C.Nerves Intact, Strength 5/5 all 4 extremities, Sensation intact all 4 extremities, Plantars down going.  4. Ears and Eyes appear Normal, Conjunctivae clear, PERRLA. Moist Oral Mucosa.  5. Supple Neck, No JVD, No cervical lymphadenopathy appriciated, No Carotid Bruits.  6. Symmetrical Chest wall movement,  + crackle left lung base , no wheezing.   7. RRR, No Gallops, Rubs or Murmurs, No Parasternal Heave.  8. Positive Bowel Sounds, Abdomen Soft, No tenderness, No organomegaly appriciated,No rebound -guarding or rigidity.  9.  No Cyanosis, Normal Skin Turgor, No Skin Rash or Bruise.  10. Good muscle tone,  joints appear normal , no effusions, Normal ROM.  11. No Palpable Lymph Nodes in Neck or Axillae     Data Review:    CBC Recent Labs  Lab 04/24/17 1853  WBC 18.8*  HGB 8.0*  HCT 26.9*  PLT 464*  MCV 76.4*  MCH 22.7*  MCHC 29.7*  RDW 21.6*   ------------------------------------------------------------------------------------------------------------------  Chemistries  Recent Labs  Lab 04/24/17 1853  NA 138  K 3.1*  CL 105  CO2 20*  GLUCOSE 89  BUN 25*  CREATININE 1.58*  CALCIUM 9.2   ------------------------------------------------------------------------------------------------------------------ estimated creatinine  clearance is 20.4 mL/min (A) (by C-G formula based on SCr of 1.58 mg/dL (H)). ------------------------------------------------------------------------------------------------------------------ No results for input(s): TSH, T4TOTAL, T3FREE, THYROIDAB in the last 72 hours.  Invalid input(s): FREET3  Coagulation profile No results for input(s): INR, PROTIME in the  last 168 hours. ------------------------------------------------------------------------------------------------------------------- No results for input(s): DDIMER in the last 72 hours. -------------------------------------------------------------------------------------------------------------------  Cardiac Enzymes No results for input(s): CKMB, TROPONINI, MYOGLOBIN in the last 168 hours.  Invalid input(s): CK ------------------------------------------------------------------------------------------------------------------    Component Value Date/Time   BNP 492.9 (H) 08/03/2016 1651     ---------------------------------------------------------------------------------------------------------------  Urinalysis    Component Value Date/Time   COLORURINE AMBER (A) 08/04/2016 2230   APPEARANCEUR TURBID (A) 08/04/2016 2230   LABSPEC 1.008 08/04/2016 2230   PHURINE 6.0 08/04/2016 2230   GLUCOSEU NEGATIVE 08/04/2016 2230   HGBUR LARGE (A) 08/04/2016 2230   BILIRUBINUR NEGATIVE 08/04/2016 2230   KETONESUR NEGATIVE 08/04/2016 2230   PROTEINUR 100 (A) 08/04/2016 2230   NITRITE POSITIVE (A) 08/04/2016 2230   LEUKOCYTESUR LARGE (A) 08/04/2016 2230    ----------------------------------------------------------------------------------------------------------------   Imaging Results:    Dg Chest 2 View  Result Date: 04/24/2017 CLINICAL DATA:  Per pt family, they reported a SpO2 reading of 77% EXAM: CHEST  2 VIEW COMPARISON:  02/19/2017 FINDINGS: The heart is enlarged. There is perihilar peribronchial thickening. Left pleural  effusion is associated with left basilar opacity obscuring the anterior portion of the hemidiaphragm. No pulmonary edema. IMPRESSION: Left lower lobe opacity and pleural effusion, most compatible with infectious infiltrate. Followup PA and lateral chest X-ray is recommended in 3-4 weeks following trial of antibiotic therapy to ensure resolution and exclude underlying malignancy. Bronchitic changes. Electronically Signed   By: Norva PavlovElizabeth  Brown M.D.   On: 04/24/2017 20:12       Assessment & Plan:    Principal Problem:   CAP (community acquired pneumonia) Active Problems:   Anemia   Hypokalemia   Leukocytosis   Renal insufficiency    Hypoxia secondary to CAP Blood culturex2 Urine strep , urine legionella antigen Start rocephin 1gm iv qday, zithromax 500mg  iv qday  Leukocytosis secondary to CAP Check cbc in am  Renal insufficiency Hydrate with ns iv Check cmp in am  Hypokalemia Replete Check cmp in am  Anemia Check cbc in am  H/o DVT Xarelto pharmacy to dose  Hypertension Cont metoprolol Cont amlodipine  Ovarian mass on CT scan 12/01/2016 Consider further w/up,     DVT Prophylaxis Lovenox - SCDs   AM Labs Ordered, also please review Full Orders  Family Communication: Admission, patients condition and plan of care including tests being ordered have been discussed with the patient  who indicate understanding and agree with the plan and Code Status.  Code Status DNR  Likely DC to home  Condition GUARDED    Consults called:  none  Admission status: inpatient  Time spent in minutes : 45   Pearson GrippeJames Famous Eisenhardt M.D on 04/25/2017 at 12:00 AM  Between 7am to 7pm - Pager - [phone]   After 7pm go to [email or web address] - password [email or web address]  [facility] - Office  [phone]

## 2017-04-25 NOTE — Progress Notes (Addendum)
Patient ID: Gloria EtienneLelia Odom, female   DOB: [birth year], 84 y.o.   MRN: [id number] Patient was admitted early this morning for cough and started on antibiotics for pneumonia.  Patient seen and examined at bedside and plan of care discussed.  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  Will consult palliative care for goals of care.  Continue antibiotics.  Repeat a.m. Labs.  Flu positive.  Start Tamiflu.

## 2017-04-25 NOTE — Telephone Encounter (Signed)
Received Physician Orders from [facility]; forwarded to provider/SLS 02/19  

## 2017-04-25 NOTE — ED Notes (Signed)
Attempted report 

## 2017-04-25 NOTE — ED Notes (Signed)
Patient pulled out 2 IV's , attempted to restart unsucessful. Patient placed on hospital bed. Family at bedside.

## 2017-04-25 NOTE — Progress Notes (Signed)
ANTICOAGULATION CONSULT NOTE - Initial Consult  Pharmacy Consult for Xarelto  Indication: h/o DVT  No Known Allergies  Patient Measurements: Height: 5\' 6"  (167.6 cm) Weight: 131 lb (59.4 kg) IBW/kg (Calculated) : 59.3  Vital Signs: Temp: 98.4 F (36.9 C) (02/19 0000) Temp Source: Rectal (02/19 0000) BP: 90/72 (02/18 2330) Pulse Rate: 61 (02/18 2330)  Labs: Recent Labs    04/24/17 1853  HGB 8.0*  HCT 26.9*  PLT 464*  CREATININE 1.58*    Estimated Creatinine Clearance: 20.4 mL/min (A) (by C-G formula based on SCr of 1.58 mg/dL (H)).   Medical History: Past Medical History:  Diagnosis Date  . Bifascicular block   . Diastolic dysfunction   . Dyslipidemia   . Hypertension   . LVH (left ventricular hypertrophy)     Medications:  No current facility-administered medications on file prior to encounter.    Current Outpatient Medications on File Prior to Encounter  Medication Sig Dispense Refill  . acetaminophen (TYLENOL) 500 MG tablet Take 1,000 mg by mouth every 6 (six) hours as needed for mild pain.    Marland Kitchen. amLODipine (NORVASC) 10 MG tablet Take 0.5 tablets (5 mg total) by mouth daily. 90 tablet 3  . Chlorpheniramine-DM (CORICIDIN HBP COUGH/COLD PO) Take 2 tablets by mouth daily as needed (cough).    . diphenhydrAMINE (BENADRYL) 25 mg capsule Take 50 mg by mouth at bedtime as needed for itching or allergies.     . feeding supplement, ENSURE ENLIVE, (ENSURE ENLIVE) LIQD Take 237 mLs by mouth 2 (two) times daily between meals. (Patient taking differently: Take 237 mLs by mouth 2 (two) times daily as needed (meal replacement). ) 60 Bottle 0  . metoprolol succinate (TOPROL-XL) 25 MG 24 hr tablet TAKE 1 TABLET BY MOUTH EVERY DAY. 90 tablet 3  . Multiple Vitamins-Minerals (CENTRUM SILVER ADULT 50+) TABS Take 1 tablet by mouth once a week.     . ondansetron (ZOFRAN) 4 MG tablet Take 1 tablet (4 mg total) by mouth every 8 (eight) hours as needed for nausea or vomiting. 20 tablet 0   . rivaroxaban (XARELTO) 20 MG TABS tablet Take 1 tablet (20 mg total) by mouth daily with supper. 30 tablet 1  . [DISCONTINUED] fentaNYL (DURAGESIC - DOSED MCG/HR) 25 MCG/HR patch Place 1 patch (25 mcg total) onto the skin every 3 (three) days. 5 patch 0     Assessment: 84 y.o. female admitted with PNA, h/o DVT, to continue Xarelto  Plan:  Xarelto 20 mg daily F/U renal function  Shakiya Mcneary, Gary FleetGregory Vernon 04/25/2017,12:51 AM

## 2017-04-25 NOTE — ED Notes (Signed)
Admitting at bedside 

## 2017-04-25 NOTE — Progress Notes (Signed)
[facility]  Patient Status: Active (receiving services up to time of hospitalization)  [facility] is providing the following services: RN and PT  If patient discharges after hours, please call [phone].   Kizzie FurnishDonna Fellmy 04/25/2017, 5:19 PM

## 2017-04-26 DIAGNOSIS — E46 Unspecified protein-calorie malnutrition: Secondary | ICD-10-CM

## 2017-04-26 DIAGNOSIS — D500 Iron deficiency anemia secondary to blood loss (chronic): Secondary | ICD-10-CM

## 2017-04-26 DIAGNOSIS — I352 Nonrheumatic aortic (valve) stenosis with insufficiency: Secondary | ICD-10-CM

## 2017-04-26 DIAGNOSIS — E860 Dehydration: Secondary | ICD-10-CM

## 2017-04-26 DIAGNOSIS — Z515 Encounter for palliative care: Secondary | ICD-10-CM

## 2017-04-26 DIAGNOSIS — Z7189 Other specified counseling: Secondary | ICD-10-CM

## 2017-04-26 DIAGNOSIS — R0902 Hypoxemia: Secondary | ICD-10-CM

## 2017-04-26 DIAGNOSIS — J9601 Acute respiratory failure with hypoxia: Secondary | ICD-10-CM

## 2017-04-26 DIAGNOSIS — R1900 Intra-abdominal and pelvic swelling, mass and lump, unspecified site: Secondary | ICD-10-CM

## 2017-04-26 DIAGNOSIS — J101 Influenza due to other identified influenza virus with other respiratory manifestations: Secondary | ICD-10-CM

## 2017-04-26 DIAGNOSIS — I82409 Acute embolism and thrombosis of unspecified deep veins of unspecified lower extremity: Secondary | ICD-10-CM

## 2017-04-26 DIAGNOSIS — A419 Sepsis, unspecified organism: Secondary | ICD-10-CM

## 2017-04-26 DIAGNOSIS — J181 Lobar pneumonia, unspecified organism: Secondary | ICD-10-CM

## 2017-04-26 DIAGNOSIS — N179 Acute kidney failure, unspecified: Secondary | ICD-10-CM

## 2017-04-26 LAB — BASIC METABOLIC PANEL
Anion gap: 12 (ref 5–15)
BUN: 22 mg/dL — AB (ref 6–20)
CO2: 18 mmol/L — ABNORMAL LOW (ref 22–32)
Calcium: 8.8 mg/dL — ABNORMAL LOW (ref 8.9–10.3)
Chloride: 114 mmol/L — ABNORMAL HIGH (ref 101–111)
Creatinine, Ser: 1.29 mg/dL — ABNORMAL HIGH (ref 0.44–1.00)
GFR calc Af Amer: 40 mL/min — ABNORMAL LOW (ref >60)
GFR calc non Af Amer: 34 mL/min — ABNORMAL LOW (ref >60)
Glucose, Bld: 88 mg/dL (ref 65–99)
Potassium: 3.7 mmol/L (ref 3.5–5.1)
SODIUM: 144 mmol/L (ref 135–145)

## 2017-04-26 LAB — CBC WITH DIFFERENTIAL/PLATELET
BASOS PCT: 0 %
Basophils Absolute: 0 10*3/uL (ref 0.0–0.1)
EOS ABS: 0 10*3/uL (ref 0.0–0.7)
EOS PCT: 0 %
HCT: 23.5 % — ABNORMAL LOW (ref 36.0–46.0)
HEMOGLOBIN: 7 g/dL — AB (ref 12.0–15.0)
LYMPHS PCT: 6 %
Lymphs Abs: 1.3 10*3/uL (ref 0.7–4.0)
MCH: 23.3 pg — AB (ref 26.0–34.0)
MCHC: 29.8 g/dL — ABNORMAL LOW (ref 30.0–36.0)
MCV: 78.1 fL (ref 78.0–100.0)
MONOS PCT: 2 %
Monocytes Absolute: 0.4 10*3/uL (ref 0.1–1.0)
NEUTROS PCT: 92 %
Neutro Abs: 19.8 10*3/uL — ABNORMAL HIGH (ref 1.7–7.7)
Platelets: 350 10*3/uL (ref 150–400)
RBC: 3.01 MIL/uL — ABNORMAL LOW (ref 3.87–5.11)
RDW: 22 % — ABNORMAL HIGH (ref 11.5–15.5)
WBC: 21.5 10*3/uL — ABNORMAL HIGH (ref 4.0–10.5)

## 2017-04-26 LAB — LEGIONELLA PNEUMOPHILA SEROGP 1 UR AG: L. PNEUMOPHILA SEROGP 1 UR AG: NEGATIVE

## 2017-04-26 LAB — MAGNESIUM: MAGNESIUM: 1.7 mg/dL (ref 1.7–2.4)

## 2017-04-26 LAB — EXPECTORATED SPUTUM ASSESSMENT W GRAM STAIN, RFLX TO RESP C: Special Requests: NORMAL

## 2017-04-26 MED ORDER — ACETAMINOPHEN 160 MG/5ML PO SOLN
500.0000 mg | Freq: Four times a day (QID) | ORAL | Status: DC
  Administered 2017-04-26 – 2017-04-27 (×5): 500 mg via ORAL
  Filled 2017-04-26 (×5): qty 20.3

## 2017-04-26 MED ORDER — MIRTAZAPINE 15 MG PO TBDP
15.0000 mg | ORAL_TABLET | Freq: Every day | ORAL | Status: DC
  Administered 2017-04-26: 15 mg via ORAL
  Filled 2017-04-26 (×2): qty 1

## 2017-04-26 MED ORDER — MORPHINE SULFATE (CONCENTRATE) 10 MG/0.5ML PO SOLN: 5.0000 mg | ORAL | Status: DC | PRN

## 2017-04-26 NOTE — Care Management Note (Addendum)
Case Management Note  Patient Details  Name: Gloria EtienneLelia Coe MRN: [id number] Date of Birth: 1920-04-05  Subjective/Objective:  Pneumonia, + Flu                Action/Plan: Patient lives at home with her daughter; PCP is Carmelia RollerWendling, Jilda RocheNicholas Paul, DO; has private insurance with [facility] with prescription drug coverage; is active with [facility] for HHRN/PT as prior to admission; CM will continue to follow for progression of care. B Aditya Nastasi RN,MHA,BSN  12:04 pm - [facility], patient want home with hospice care; CM talked to daughter Claiborne BillingsLeezella to offer home hospice choice, [facility]; Referral made as requested. Abelino DerrickB Earlie Schank RN,MHA,BSN   Expected Discharge Date:    possibly 04/30/2017              Expected Discharge Plan:  Home w Home Health Services  Discharge planning Services  CM Consult  Status of Service:  In process, will continue to follow  Reola MosherChandler, Emerald Gehres L, RN,MHA,BSN [phone] 04/26/2017, 10:26 AM

## 2017-04-26 NOTE — Progress Notes (Signed)
Palliative NP met with patient, daughter, and granddaughter at bedside. After discussion, patient and family wish to focus on her "comfort" but continue course of antibiotics for pneumonia. Continue current plan of care with disposition goal of home with hospice. MOST form completed with patient and family. DNR/DNI, comfort measures, antibiotics/IVF for time trial, no feeding tube. Medications added for symptom management. Discussed with Dr. Sarajane Jews and RN CM. Full note to follow.   NO CHARGE  Ihor Dow, FNP-C Palliative Medicine Team  Phone: [phone] Fax: [phone]

## 2017-04-26 NOTE — Plan of Care (Signed)
Progressing

## 2017-04-26 NOTE — Telephone Encounter (Signed)
Copied from CRM [id number]. Topic: General - Other >> Apr 26, 2017  2:43 PM Raquel SarnaHayes, Teresa G wrote: Amy w/ Hospice [phone]  Pt is requesting Dr. Carmelia RollerWendling to be the attend of record for Hospice. [facility] [place]. Please call Amy back to let her know if Dr. Carmelia RollerWendling agrees.

## 2017-04-26 NOTE — Telephone Encounter (Signed)
Hospice informed of PCP ok 

## 2017-04-26 NOTE — Telephone Encounter (Signed)
My understanding of this is that I will sign off on orders that the hospice team will carry out. If this is the case, I am happy to do this. TY.

## 2017-04-26 NOTE — Progress Notes (Signed)
PROGRESS NOTE  Elease EtienneLelia Cavalieri ZOX:[id number] DOB: 04/22/1920 DOA: 04/24/2017 PCP: Sharlene DoryWendling, Nicholas Paul, DO  Brief Narrative: 89yow hypoxic at home when checked by PT so sent to ED. Admitted for lobar pneumonia.  Assessment/Plan Sepsis secondary to pneumonia, with associated acute hypoxic respiratory failure - afebrile, WBC slightly lower, SpO2 stable on oxygen. Remains ill. - continue IV abx, oxygen, nebulizer as needed  LLL lobar pneumonia - continue empiric abx, treatment as above  Influenza A - Tamiflu  AKI - creatinine trending down with IVF, expect spontaneous resolution. No further labs  Anemia of critical illness superimposed on chronic microcytic anemia  - suspect dilutional component on chronic blood loss. Daughter reports patient has a lot of spotting at home.  - given GOC, no further labs  Abdominal mass with pain - refuses evaluation and treatment - continue fentanyl patch per PCP  RLE DVT 02/2017 - continue Xarelto for now  Severe aortic stenosis. Patient has chronic diastolic dysfunction. Suspect elevated BNP and LE edema secondary    Discussed with daughter, will continue to treat pneumonia, hypoxia, influenza, AKI; we discussed anemia with Hgb borderline. Given GOC per PMT of home with hospice and conservative management, coupled with history of significant weight loss, hypersomnia, declining condition as outpatient, will not pursue further CBC or transfusion, and daughter in agreement. Will discuss Xarelto with daughter in AM, would recommend stopping this medication on discharge.   DVT prophylaxis: Xarelto Code Status: DNR Family Communication: daughter at bedside Disposition Plan: home 2/21 with hospice    Brendia Sacksaniel Goodrich, MD  [facility] Direct contact: [phone] --Via amion app OR  --[email or web address]; password [email or web address]  7PM-7AM contact night coverage as above 04/26/2017, 3:45 PM  LOS: 1 day    Consultants:  PMT  Procedures:    Antimicrobials:  Ceftriaxone 2/18 >>  Azithromycin 2/18 >>  Tamiflu 2/19 >>  Interval history/Subjective: Feels ok.  Daughter at bedside, reports patient a bit better today. However, has been declining over last 3 months since dx of DVT. Sleeps most of the time, doesn't watch TV as she used to. Has been losing a lot of weight and doesn't eat well. Seems "like it's a new problem every week".  Objective: Vitals:  Vitals:   04/26/17 0508 04/26/17 1357  BP: (!) 105/38 (!) 97/33  Pulse: 93 91  Resp:  (!) 22  Temp:  98.3 F (36.8 C)  SpO2: 97% 94%    Exam:  Constitutional:  . Appears ill, SOB, but calm and comfortable Eyes:  . pupils and irises appear normal . Normal lids  ENMT:  . grossly normal hearing  . Lips appear normal Respiratory:  . CTA bilaterally, decreased breath sounds, no w/r/r. . Respiratory effort moderately increased. Able to speak in short sentences.  Cardiovascular:  . RRR, 2/6 systolic murmur, no r/g.  Marland Kitchen. 2+ BLE pitting extremity edema   Psychiatric:  . Mental status o Mood, affect appropriate o confused . judgement and insight impaired   I have personally reviewed the following:   Labs:  Creatinine trending down, 1.29   CO2 18  Hgb trending down, 7.7 >> 7.0  Imaging studies:  CXR LLL opacity, small pleural effusion  Scheduled Meds: . acetaminophen (TYLENOL) oral liquid 160 mg/5 mL  500 mg Oral Q6H  . amLODipine  5 mg Oral Daily  . feeding supplement (ENSURE ENLIVE)  237 mL Oral BID BM  . mirtazapine  15 mg Oral QHS  . oseltamivir  30 mg Oral Daily  . rivaroxaban  20  mg Oral Q supper   Continuous Infusions: . sodium chloride 75 mL/hr at 04/25/17 2226  . azithromycin Stopped (04/26/17 0111)  . cefTRIAXone (ROCEPHIN)  IV Stopped (04/25/17 2342)    Active Problems:   Aortic valve stenosis with insufficiency   AKI (acute kidney injury) (HCC)   Sepsis (HCC)   Lobar pneumonia (HCC)    Acute hypoxemic respiratory failure (HCC)   Influenza A   Iron deficiency anemia due to chronic blood loss   DVT (deep venous thrombosis) (HCC)   Abdominal mass   LOS: 1 day      Time <35 minutes, >50% in discussion with daughter as above and with PMT Ms. Marlene Bast.

## 2017-04-26 NOTE — Progress Notes (Addendum)
[facility] Lia[facility]: RN visit  Notified by Jiles CrockerBrenda Chandler, [facility] of patient/family [facility] after discharge. Hospice eligibility is approved.  Writer spoke with daughter, Claiborne BillingsLeezella at bedside to initiate education related to hospice philosophy, services and team approach to care. Daughter verbalized understanding of information given. Per discussion, plan is for patient to discharge 1-2 days.   Please send signed and completed DNR form home with patient/family. Patient will need prescriptions for discharge comfort medications. DME needs have been discussed, patient currently has the following equipment in the home: Dan HumphreysWalker, W/C, 3N1. Patient/family requests the following DME for delivery to the home: hospital bed, OBT, oxygen. [facility] equipment manager has been notified and will contact [facility] to arrange delivery to the home. Home address has been verified and is correct in the chart. Marvis RepressLeezella Longsworth is the family member to contact to arrange time of delivery.  [facility] Referral Center aware of the above. Please notify [facility] when patient is ready to leave the unit at discharge. (Call [phone] or [phone] after 5pm.) [facility] information and contact numbers given to at time of visit. Above information shared with CMRN.  Please call with any hospice related questions.  Thank you for this referral. Elsie SaasMary Anne Robertson, RN, [facility] Liaison [phone] ? [facility] Hospital liaisons are now on AMION.

## 2017-04-26 NOTE — Consult Note (Signed)
Consultation Note Date: 04/26/17  Patient Name: Gloria Odom  DOB: [birth year]  MRN: [id number]  Age / Sex: 84 y.o., female  PCP: Shelda Pal, DO Referring Physician: Samuella Cota, MD  Reason for Consultation: Establishing goals of care  HPI/Patient Profile: 84 y.o. female  with past medical history of hypertension, dyslipidemia, LVH, diastolic dysfunction, and aortic stenosis admitted on 04/24/2017 with cough and low oxygen saturations. In ED, WBC's 18.8 and lactic acid 3.10. Chest xray revealed LLL pnuemonia. Positive for flu. Hospitalization in December for right leg DVT. Recently found to have abdominal mass but patient/family decline further workup. Patient has been started on IVF and antibiotics. Palliative medicine consultation for goals of care.   Clinical Assessment and Goals of Care: I have reviewed medical records, discussed with care team, and met with patient, daughter Caroline More), and grandaughter Shirlean Mylar) to discuss diagnosis, prognosis, GOC, EOL wishes, disposition and options.  Patient awake, alert, oriented and able to participate in [place] conversation.   Introduced Palliative Medicine as specialized medical care for people living with serious illness. It focuses on providing relief from the symptoms and stress of a serious illness. The goal is to improve quality of life for both the patient and the family.  We discussed a brief life review of the patient. Lives home with daughter. They are originally from [place]. Ms. Brackens raised her three grandchildren while [place] worked. Caroline More speaks of her declining health (functional and nutritional status) since a hospitalization last May, for dehydration/acute kidney injury. The abdominal mass was found over a year ago, but the patient was very clear on not pursuing oncology workup. She was also hospitalized in December for DVT. In  the last week, Ms. Armes has been in bed, sleeping more than awake, and refusing to eat. She was started on appetite stimulant by PCP but refused to take the medicine. Daughter speaks of significant weight loss of 30+ lbs in the past few months.   Discussed hospital diagnoses and interventions. Discussed decrease in functional and nutritional status being multi-factorial from likely progressive abdominal malignancy, underlying aortic stenosis, now with pneumonia and flu, and frailty/overall failure to thrive. Also discussed concern with weight loss/decrease in appetite being contributed to progressive malignancy. Daughter confirms they do not want oncology workup or chemotherapy.   Advanced directives, concepts specific to code status, artifical feeding and hydration, and rehospitalization were considered and discussed. Patient speaks of talking with her daughter multiple times about her wishes. She confirms that she does NOT want to be resuscitated. Ms. Schweppe speaks of wanting "comfort" and "peace" at the end of her life. Ms. Tuzzolino speaks of living a good life and speaks of spiritually being ready to die when God calls her home. Caroline More and Shirlean Mylar become tearful but agree that these are the wishes she has spoken of in the past.    Introduced and completed MOST form with patient and family. DNR/DNI, comfort focused care, time trial ABX/IVF, and no feeding tube.   Caroline More speaks of her mother not wanting  to come to the hospital this admission. They had a palliative outpatient appointment scheduled for today. I discussed outpatient palliative versus hospice options. Explained hospice focus on comfort, symptom management, and preventing re-hospitalization. Also focus on comfort, quality, dignity, and peace at EOL. Caroline More feels hospice services will provide her mother and family the best support for remaining days.   Caroline More asks her mother if she would like to go home today or continue course of  IV antibiotics. The patient is agreeable to finish IV antibiotics and return home with hospice.   Discussed symptom management needs. Ms. Faraone complains of right leg pain from recent DVT. She states relief from tylenol and does not like stronger medications that have been prescribed (fentanyl, oxy). Daughter agrees with scheduling tylenol for pain relief. Also restarting remeron if she will take.  Therapeutic listening as family shares stories of Ms. Weinberg and her love for word searches and soap operas. Emotional/spiritual support provided. Questions/concerns addressed.    SUMMARY OF RECOMMENDATIONS    DNR/DNI. No escalation of care.   MOST form completed with patient/family. DNR/DNI, comfort focused care, time trial ABX/IVF, no feeding tube.   Symptom management--see below.   Patient/family wish to continue course of IV antibiotics (per attending) and return home with hospice services. RN CM notified.   Code Status/Advance Care Planning:  DNR  Symptom Management:   Tylenol '500mg'$  PO q6h   Roxanol '5mg'$  SL q4h prn severe pain/dyspnea  Remeron '15mg'$  PO HS  Palliative Prophylaxis:   Aspiration, Delirium Protocol, Frequent Pain Assessment, Oral Care and Turn Reposition  Additional Recommendations (Limitations, Scope, Preferences):  DNR/DNI. Continue medical management for pneumonia/flu.  Psycho-social/Spiritual:   Desire for further Chaplaincy support:yes  Additional Recommendations: Caregiving  Support/Resources and Education on Hospice  Prognosis:   Guarded with progressive decline of functional and nutritional status likely secondary to abdominal mass/malignancy (not pursuing workup), aortic stenosis, pneumonia/flu, and overall failure to thrive.   Discharge Planning: Home with Hospice      Primary Diagnoses: Present on Admission: . Hypokalemia . CAP (community acquired pneumonia) . Anemia . Pneumonia   I have reviewed the medical record, interviewed the  patient and family, and examined the patient. The following aspects are pertinent.  Past Medical History:  Diagnosis Date  . Bifascicular block   . Diastolic dysfunction   . Dyslipidemia   . Hypertension   . LVH (left ventricular hypertrophy)    Social History   Socioeconomic History  . Marital status: Widowed    Spouse name: None  . Number of children: 2  . Years of education: None  . Highest education level: None  Social Needs  . Financial resource strain: None  . Food insecurity - worry: None  . Food insecurity - inability: None  . Transportation needs - medical: None  . Transportation needs - non-medical: None  Occupational History  . None  Tobacco Use  . Smoking status: Former Research scientist (life sciences)  . Smokeless tobacco: Never Used  Substance and Sexual Activity  . Alcohol use: No  . Drug use: No  . Sexual activity: No    Birth control/protection: Post-menopausal  Other Topics Concern  . None  Social History Narrative  . None   Family History  Problem Relation Age of Onset  . Diabetes Mother   . Kidney failure Mother   . Emphysema Father   . Stroke Maternal Grandmother   . Stroke Maternal Grandfather   . Emphysema Brother   . Asthma Sister   . Cancer Sister   .  Hyperlipidemia Sister   . Heart attack Child    Scheduled Meds: . acetaminophen (TYLENOL) oral liquid 160 mg/5 mL  500 mg Oral Q6H  . amLODipine  5 mg Oral Daily  . feeding supplement (ENSURE ENLIVE)  237 mL Oral BID BM  . metoprolol succinate  25 mg Oral Daily  . mirtazapine  15 mg Oral QHS  . oseltamivir  30 mg Oral Daily  . rivaroxaban  20 mg Oral Q supper   Continuous Infusions: . sodium chloride 75 mL/hr at 04/25/17 2226  . azithromycin Stopped (04/26/17 0111)  . cefTRIAXone (ROCEPHIN)  IV Stopped (04/25/17 2342)   PRN Meds:.acetaminophen, diphenhydrAMINE, morphine CONCENTRATE, ondansetron Medications Prior to Admission:  Prior to Admission medications   Medication Sig Start Date End Date Taking?  Authorizing Provider  acetaminophen (TYLENOL) 500 MG tablet Take 1,000 mg by mouth every 6 (six) hours as needed for mild pain.   Yes [provider]  amLODipine (NORVASC) 10 MG tablet Take 0.5 tablets (5 mg total) by mouth daily. 04/10/17  Yes Shelda Pal, DO  Chlorpheniramine-DM (CORICIDIN HBP COUGH/COLD PO) Take 2 tablets by mouth daily as needed (cough).   Yes [provider]  diphenhydrAMINE (BENADRYL) 25 mg capsule Take 50 mg by mouth at bedtime as needed for itching or allergies.    Yes [provider]  feeding supplement, ENSURE ENLIVE, (ENSURE ENLIVE) LIQD Take 237 mLs by mouth 2 (two) times daily between meals. Patient taking differently: Take 237 mLs by mouth 2 (two) times daily as needed (meal replacement).  08/06/16  Yes Mikhail, [place], DO  metoprolol succinate (TOPROL-XL) 25 MG 24 hr tablet TAKE 1 TABLET BY MOUTH EVERY DAY. 08/12/16  Yes Hilty, Nadean Corwin, MD  Multiple Vitamins-Minerals (CENTRUM SILVER ADULT 50+) TABS Take 1 tablet by mouth once a week.    Yes [provider]  ondansetron (ZOFRAN) 4 MG tablet Take 1 tablet (4 mg total) by mouth every 8 (eight) hours as needed for nausea or vomiting. 04/10/17  Yes Shelda Pal, DO  rivaroxaban (XARELTO) 20 MG TABS tablet Take 1 tablet (20 mg total) by mouth daily with supper. 03/21/17 05/20/17 Yes Wendling, Crosby Oyster, DO   No Known Allergies Review of Systems  Constitutional: Positive for activity change and appetite change.       Right leg pain  Respiratory: Positive for shortness of breath.   Neurological: Positive for weakness.   Physical Exam  Constitutional: She appears ill.  HENT:  Head: Normocephalic and atraumatic.  Cardiovascular: Regular rhythm.  Pulmonary/Chest: She has decreased breath sounds.  Dyspnea at rest  Neurological: She is alert.  Oriented to person and place. Pleasantly confused.  Skin: Skin is warm and dry.  Psychiatric: Her speech is delayed. She  is inattentive.  Nursing note and vitals reviewed.  Vital Signs: BP (!) 105/38 (BP Location: Right Arm)   Pulse 93   Temp 98.4 F (36.9 C) (Rectal)   Resp 18   Ht '5\' 6"'$  (1.676 m)   Wt 59.4 kg (131 lb)   SpO2 97%   BMI 21.14 kg/m  Pain Assessment: 0-10   Pain Score: 6    SpO2: SpO2: 97 % O2 Device:SpO2: 97 % O2 Flow Rate: .O2 Flow Rate (L/min): 2 L/min  IO: Intake/output summary:   Intake/Output Summary (Last 24 hours) at 04/26/2017 1209 Last data filed at 04/26/2017 0534 Gross per 24 hour  Intake 2357.5 ml  Output -  Net 2357.5 ml    LBM: Last BM  Date: 04/23/17 Baseline Weight: Weight: 59.4 kg (131 lb) Most recent weight: Weight: 59.4 kg (131 lb)     Palliative Assessment/Data: PPS 30%   Flowsheet Rows     Most Recent Value  Intake Tab  Referral Department  Hospitalist  Unit at Time of Referral  Med/Surg Unit  Palliative Care Primary Diagnosis  Sepsis/Infectious Disease  Palliative Care Type  New Palliative care  Reason for referral  Clarify Goals of Care  Date first seen by Palliative Care  04/26/17  Clinical Assessment  Palliative Performance Scale Score  30%  Psychosocial & Spiritual Assessment  Palliative Care Outcomes  Patient/Family meeting held?  Yes  Who was at the meeting?  patient, daughter, granddaughter  Palliative Care Outcomes  Clarified goals of care, Provided end of life care assistance, Provided psychosocial or spiritual support, Improved pain interventions, Improved non-pain symptom therapy, Counseled regarding hospice, Provided advance care planning, Completed durable DNR, ACP counseling assistance      Time In: 68 Time Out: 1150 Time Total: 62mn Greater than 50%  of this time was spent counseling and coordinating care related to the above assessment and plan.  Signed by:  MIhor Dow FNP-C Palliative Medicine Team  Phone: [phone]: [phone]  Please contact Palliative Medicine Team phone at [phone] questions  and concerns.  For individual provider: See AShea Evans

## 2017-04-27 DIAGNOSIS — R0902 Hypoxemia: Secondary | ICD-10-CM

## 2017-04-27 DIAGNOSIS — Z7189 Other specified counseling: Secondary | ICD-10-CM

## 2017-04-27 DIAGNOSIS — E860 Dehydration: Secondary | ICD-10-CM

## 2017-04-27 DIAGNOSIS — Z515 Encounter for palliative care: Secondary | ICD-10-CM

## 2017-04-27 DIAGNOSIS — E46 Unspecified protein-calorie malnutrition: Secondary | ICD-10-CM

## 2017-04-27 LAB — BASIC METABOLIC PANEL
Anion gap: 12 (ref 5–15)
BUN: 23 mg/dL — AB (ref 6–20)
CO2: 16 mmol/L — ABNORMAL LOW (ref 22–32)
CREATININE: 1.35 mg/dL — AB (ref 0.44–1.00)
Calcium: 8.4 mg/dL — ABNORMAL LOW (ref 8.9–10.3)
Chloride: 115 mmol/L — ABNORMAL HIGH (ref 101–111)
GFR calc Af Amer: 38 mL/min — ABNORMAL LOW (ref >60)
GFR, EST NON AFRICAN AMERICAN: 32 mL/min — AB (ref >60)
Glucose, Bld: 114 mg/dL — ABNORMAL HIGH (ref 65–99)
Potassium: 4.4 mmol/L (ref 3.5–5.1)
Sodium: 143 mmol/L (ref 135–145)

## 2017-04-27 MED ORDER — MIRTAZAPINE 15 MG PO TBDP: 15.0000 mg | ORAL_TABLET | Freq: Every day | ORAL | 0 refills | Status: AC

## 2017-04-27 MED ORDER — OSELTAMIVIR PHOSPHATE 30 MG PO CAPS: 30.0000 mg | ORAL_CAPSULE | Freq: Every day | ORAL | 0 refills | Status: AC

## 2017-04-27 MED ORDER — AZITHROMYCIN 500 MG PO TABS: 500.0000 mg | ORAL_TABLET | Freq: Every day | ORAL | 0 refills | Status: DC

## 2017-04-27 MED ORDER — CEFUROXIME AXETIL 500 MG PO TABS: 500.0000 mg | ORAL_TABLET | Freq: Every day | ORAL | 0 refills | Status: AC

## 2017-04-27 MED ORDER — MORPHINE SULFATE (CONCENTRATE) 10 MG/0.5ML PO SOLN: 5.0000 mg | ORAL | 0 refills | Status: AC | PRN

## 2017-04-27 MED ORDER — AZITHROMYCIN 500 MG PO TABS: 500.0000 mg | ORAL_TABLET | Freq: Every day | ORAL | 0 refills | Status: AC

## 2017-04-27 MED ORDER — ACETAMINOPHEN 160 MG/5ML PO SOLN: 500.0000 mg | Freq: Four times a day (QID) | ORAL | 0 refills | Status: AC

## 2017-04-27 NOTE — Progress Notes (Signed)
MD said to not give the xarelto.

## 2017-04-27 NOTE — Progress Notes (Signed)
Waiting on oxygen tank to be delivered to room before discharge.

## 2017-04-27 NOTE — Progress Notes (Signed)
Daily Progress Note   Patient Name: Gloria Odom       Date: 04/27/2017 DOB: 1921-02-24  Age: 84 y.o. MRN#: [id number] Attending Physician: Standley BrookingGoodrich, Daniel P, MD Primary Care Physician: Sharlene DoryWendling, Nicholas Paul, DO Admit Date: 04/24/2017  Reason for Consultation/Follow-up: Establishing goals of care  Subjective: Patient resting comfortably during my visit. No s/s of distress.   GOC: Daughter at bedside. Again discussed hospital diagnoses, interventions, and underlying co-morbidities. Discussed plan to go home with hospice services. She is eager to discharge today and waiting on attending. Daughter tells me tylenol is helping with leg pain. Her mother has only taken a few bites of food today.   Length of Stay: 2  Current Medications: Scheduled Meds:  . acetaminophen (TYLENOL) oral liquid 160 mg/5 mL  500 mg Oral Q6H  . feeding supplement (ENSURE ENLIVE)  237 mL Oral BID BM  . mirtazapine  15 mg Oral QHS  . oseltamivir  30 mg Oral Daily  . rivaroxaban  20 mg Oral Q supper    Continuous Infusions: . sodium chloride 75 mL/hr at 04/27/17 0303  . azithromycin Stopped (04/27/17 0110)  . cefTRIAXone (ROCEPHIN)  IV Stopped (04/26/17 2358)    PRN Meds: acetaminophen, diphenhydrAMINE, morphine CONCENTRATE, ondansetron  Physical Exam  Constitutional: She is easily aroused.  HENT:  Head: Normocephalic and atraumatic.  Pulmonary/Chest: No accessory muscle usage. No tachypnea. No respiratory distress.  Neurological: She is easily aroused.  Skin: Skin is warm and dry.  Nursing note and vitals reviewed.          Vital Signs: BP (!) 94/50   Pulse 70   Temp 97.9 F (36.6 C)   Resp 19   Ht 5\' 6"  (1.676 m)   Wt 59.4 kg (131 lb)   SpO2 100%   BMI 21.14 kg/m  SpO2: SpO2: 100 % O2  Device: O2 Device: Nasal Cannula O2 Flow Rate: O2 Flow Rate (L/min): 2 L/min  Intake/output summary:   Intake/Output Summary (Last 24 hours) at 04/27/2017 1610 Last data filed at 04/27/2017 0959 Gross per 24 hour  Intake 1610 ml  Output 450 ml  Net 1160 ml   LBM: Last BM Date: 04/23/17 Baseline Weight: Weight: 59.4 kg (131 lb) Most recent weight: Weight: 59.4 kg (131 lb)       Palliative Assessment/Data: PPS 30%  Flowsheet Rows     Most Recent Value  Intake Tab  Referral Department  Hospitalist  Unit at Time of Referral  Med/Surg Unit  Palliative Care Primary Diagnosis  Sepsis/Infectious Disease  Palliative Care Type  New Palliative care  Reason for referral  Clarify Goals of Care  Date first seen by Palliative Care  04/26/17  Clinical Assessment  Palliative Performance Scale Score  30%  Psychosocial & Spiritual Assessment  Palliative Care Outcomes  Patient/Family meeting held?  Yes  Who was at the meeting?  patient, daughter, granddaughter  Palliative Care Outcomes  Clarified goals of care, Provided end of life care assistance, Provided psychosocial or spiritual support, Improved pain interventions, Improved non-pain symptom therapy, Counseled regarding hospice, Provided advance care planning, Completed durable DNR, ACP counseling assistance      Patient Active Problem List   Diagnosis Date Noted  . Dehydration   . Hypoxia   . Palliative care by specialist   . Goals of care, counseling/discussion   . Protein-calorie malnutrition (HCC)   . Sepsis (HCC) 04/26/2017  . Lobar pneumonia (HCC) 04/26/2017  . Acute hypoxemic respiratory failure (HCC) 04/26/2017  . Influenza A 04/26/2017  . Iron deficiency anemia due to chronic blood loss 04/26/2017  . DVT (deep venous thrombosis) (HCC) 04/26/2017  . Abdominal mass 04/26/2017  . Community acquired pneumonia of left lower lobe of lung (HCC) 04/24/2017  . Encounter for palliative care 12/14/2016  . Abdominal pain  11/29/2016  . Weight loss 11/29/2016  . History of uterine fibroid 11/29/2016  . Ovarian mass, left 11/29/2016  . CKD (chronic kidney disease) stage 4, GFR 15-29 ml/min (HCC) 08/23/2016  . AKI (acute kidney injury) (HCC) 08/23/2016  . Vomiting 08/03/2016  . Loss of appetite 08/03/2016  . Chills 08/03/2016  . Aortic valve stenosis with insufficiency 06/16/2015  . Physical deconditioning 05/10/2015  . HTN (hypertension) 06/12/2013  . Dyslipidemia 06/12/2013  . PAC (premature atrial contraction) 06/12/2013    Palliative Care Assessment & Plan   Patient Profile: 84 y.o. female  with past medical history of hypertension, dyslipidemia, LVH, diastolic dysfunction, and aortic stenosis admitted on 04/24/2017 with cough and low oxygen saturations. In ED, WBC's 18.8 and lactic acid 3.10. Chest xray revealed LLL pnuemonia. Positive for flu. Hospitalization in December for right leg DVT. Recently found to have abdominal mass but patient/family decline further workup. Patient has been started on IVF and antibiotics. Palliative medicine consultation for goals of care.   Assessment: Pneumonia Influenza Abdominal mass Aortic stenosis Protein-calorie malnutrition  Recommendations/Plan:  DNR/DNI  MOST form completed and in chart.   Continue scheduled tylenol and prn Roxanol for pain/dyspnea.  Plan is for home with hospice services. Likely today.   Code Status: DNR   Code Status Orders  (From admission, onward)        Start     Ordered   04/25/17 0111  Do not attempt resuscitation (DNR)  Continuous    Question Answer Comment  In the event of cardiac or respiratory ARREST Do not call a "code blue"   In the event of cardiac or respiratory ARREST Do not perform Intubation, CPR, defibrillation or ACLS   In the event of cardiac or respiratory ARREST Use medication by any route, position, wound care, and other measures to relive pain and suffering. May use oxygen, suction and manual treatment  of airway obstruction as needed for comfort.      04/25/17 0110    Code Status History    Date Active Date  Inactive Code Status Order ID Comments User Context   04/25/2017 00:47 04/25/2017 01:10 Full Code [id number]  Pearson Grippe, MD ED   08/04/2016 22:55 08/06/2016 15:48 Full Code [id number]  Eduard Clos, MD ED       Prognosis:   < 3 months if not significantly less with functional and nutritional status decline. Poor appetite with underlying abdominal mass concerning for malignancy, not pursuing treatment. Also with pneumonia and influenza.  Discharge Planning:  Home with Hospice  Care plan was discussed with patient, daughter, Dr Irene Limbo  Thank you for allowing the Palliative Medicine Team to assist in the care of this patient.   Time In: 1555 Time Out: 1610 Total Time Prolonged Time Billed no      Greater than 50%  of this time was spent counseling and coordinating care related to the above assessment and plan.  Vennie Homans, FNP-C Palliative Medicine Team  Phone: [phone] Fax: [phone]  Please contact Palliative Medicine Team phone at [phone] for questions and concerns.

## 2017-04-27 NOTE — Progress Notes (Signed)
Elease EtienneLelia Shatto to be D/C'd Home per MD order.  Discussed with the patient and all questions fully answered.  VSS, Skin clean, dry and intact without evidence of skin break down, no evidence of skin tears noted. IV catheter discontinued intact. Site without signs and symptoms of complications. Dressing and pressure applied.  An After Visit Summary was printed and given to the patient. Patient received prescription.  D/c education completed with patient/family including follow up instructions, medication list, d/c activities limitations if indicated, with other d/c instructions as indicated by MD - patient able to verbalize understanding, all questions fully answered.   Patient instructed to return to ED, call 911, or call MD for any changes in condition.   Patient escorted via WC, and D/C home via private auto.  Marca AnconaLaura M Satya Buttram 04/27/2017 4:59 PM

## 2017-04-27 NOTE — Progress Notes (Signed)
MD said he will D/C amlodipine due to consistent low blood pressures.

## 2017-04-27 NOTE — Discharge Summary (Signed)
Physician Discharge Summary  Gloria Odom YNW:[id number] DOB: May 22, 1920 DOA: 04/24/2017  PCP: Sharlene Dory, DO  Admit date: 04/24/2017 Discharge date: 04/27/2017  Recommendations for Outpatient Follow-up:  1. Home with hospice     Discharge Diagnoses:  1. Sepsis secondary to pneumonia, with associated acute hypoxic respiratory failure 2. LLL lobar pneumonia 3. Influenza A 4. AKI 5. Anemia of critical illness superimposed on chronic microcytic anemia  6. Abdominal mass with pain 7. FTT with significant weight loss 8. RLE DVT 02/2017 9. Severe aortic stenosis  Discharge Condition: stable, prognosis guarded Disposition: home with hospice  Diet recommendation: as desired  Filed Weights   04/24/17 1832  Weight: 59.4 kg (131 lb)    History of present illness:  89yow hypoxic at home when checked by PT so sent to ED. Admitted for lobar pneumonia.  Hospital Course:  Patient was treated for pneumonia and sepsis with IV abx and showed some clinical improvement. After further history and discussion with daughter, in conference with PMT, daughter desired to pursue more conservative approach with focus on comfort and trial of abx and initiation of hospice at home. Hospice accepted patient and all equipment delivered. After further discussion with daughter, patient discharged home with hospice.  Sepsis secondary to pneumonia, with associated acute hypoxic respiratory failure - clinically improved. Will go home on oral antibiotics and oxygen  LLL lobar pneumonia - continue empiric abx, treatment as above  Influenza A - Tamiflu to be completed as outpatient  AKI - some improvement. Given goals of care no further evaluation planned.  Anemia of critical illness superimposed on chronic microcytic anemia  - suspect dilutional component on chronic blood loss. Daughter reports patient has a lot of spotting at home.  - given GOC, no further labs  Abdominal mass with  pain - refused evaluation and treatment  FTT with significant weight loss  RLE DVT 02/2017 - discussed with daughter and stopped Xarelto on discharge  Severe aortic stenosis. Patient has chronic diastolic dysfunction. Suspect elevated BNP and LE edema secondary   Daughter understands patient may not recover but patient clearly expressed wishes and daughter took home with hospice. Discussed medications and narrowed appropriately. Given anemia, ongoing chronic bleeding, DVT no good options. Elected to stop anticoagulation.   Consultants:  PMT  Procedures:    Antimicrobials:  Ceftriaxone 2/18 >> 2/20  Ceftin 2/20 >>  Azithromycin 2/18 >> 2/22  Tamiflu 2/19 >> 2/23  Today's assessment: S: resting well after Tylenol per daughter. Appears more comfortable today per daughter.  O: Vitals:  Vitals:   04/27/17 0541 04/27/17 1028  BP: (!) 95/54 (!) 94/50  Pulse: 76 70  Resp: 19   Temp: 97.9 F (36.6 C)   SpO2: 100%     Constitutional:  . Appears calm and comfortable Respiratory:  . Coarse breath sounds, decreased breath sounds on left . Respiratory effort norma Cardiovascular:  . RRR, no m/r/g Psychiatric:  . Mental status o Mood, affect appropriate    Discharge Instructions  Discharge Instructions    Diet - low sodium heart healthy   Complete by:  As directed    Discharge instructions   Complete by:  As directed    Call your hospice or your physician for uncontrolled pain, shortness of breath or discomfort unrelieved with pain medication.   Increase activity slowly   Complete by:  As directed      Allergies as of 04/27/2017   No Known Allergies     Medication List  STOP taking these medications   acetaminophen 500 MG tablet Commonly known as:  TYLENOL Replaced by:  acetaminophen 160 MG/5ML solution   amLODipine 10 MG tablet Commonly known as:  NORVASC   CENTRUM SILVER ADULT 50+ Tabs   metoprolol succinate 25 MG 24 hr tablet Commonly  known as:  TOPROL-XL   rivaroxaban 20 MG Tabs tablet Commonly known as:  XARELTO     TAKE these medications   acetaminophen 160 MG/5ML solution Commonly known as:  TYLENOL Take 15.6 mLs (500 mg total) by mouth every 6 (six) hours. Replaces:  acetaminophen 500 MG tablet   azithromycin 500 MG tablet Commonly known as:  ZITHROMAX Take 1 tablet (500 mg total) by mouth daily for 2 days.   cefUROXime 500 MG tablet Commonly known as:  CEFTIN Take 1 tablet (500 mg total) by mouth at bedtime for 4 days.   CORICIDIN HBP COUGH/COLD PO Take 2 tablets by mouth daily as needed (cough).   diphenhydrAMINE 25 mg capsule Commonly known as:  BENADRYL Take 50 mg by mouth at bedtime as needed for itching or allergies.   feeding supplement (ENSURE ENLIVE) Liqd Take 237 mLs by mouth 2 (two) times daily between meals. What changed:    when to take this  reasons to take this   mirtazapine 15 MG disintegrating tablet Commonly known as:  REMERON SOL-TAB Take 1 tablet (15 mg total) by mouth at bedtime.   morphine CONCENTRATE 10 MG/0.5ML Soln concentrated solution Place 0.25 mLs (5 mg total) under the tongue every 4 (four) hours as needed for severe pain or shortness of breath.   ondansetron 4 MG tablet Commonly known as:  ZOFRAN Take 1 tablet (4 mg total) by mouth every 8 (eight) hours as needed for nausea or vomiting.   oseltamivir 30 MG capsule Commonly known as:  TAMIFLU Take 1 capsule (30 mg total) by mouth daily. Start taking on:  04/28/2017      No Known Allergies  The results of significant diagnostics from this hospitalization (including imaging, microbiology, ancillary and laboratory) are listed below for reference.    Significant Diagnostic Studies: Dg Chest 2 View  Result Date: 04/24/2017 CLINICAL DATA:  Per pt family, they reported a SpO2 reading of 77% EXAM: CHEST  2 VIEW COMPARISON:  02/19/2017 FINDINGS: The heart is enlarged. There is perihilar peribronchial thickening.  Left pleural effusion is associated with left basilar opacity obscuring the anterior portion of the hemidiaphragm. No pulmonary edema. IMPRESSION: Left lower lobe opacity and pleural effusion, most compatible with infectious infiltrate. Followup PA and lateral chest X-ray is recommended in 3-4 weeks following trial of antibiotic therapy to ensure resolution and exclude underlying malignancy. Bronchitic changes. Electronically Signed   By: Norva PavlovElizabeth  Brown M.D.   On: 04/24/2017 20:12    Microbiology: Recent Results (from the past 240 hour(s))  Culture, blood (routine x 2)     Status: None (Preliminary result)   Collection Time: 04/24/17  1:45 AM  Result Value Ref Range Status   Specimen Description BLOOD LEFT ANTECUBITAL  Final   Special Requests   Final    BOTTLES DRAWN AEROBIC AND ANAEROBIC Blood Culture adequate volume   Culture   Final    NO GROWTH 1 DAY Performed at [facility], [address] [place], [place] [address]    Report Status PENDING  Incomplete  Culture, blood (routine x 2)     Status: None (Preliminary result)   Collection Time: 04/24/17 11:52 PM  Result Value Ref  Range Status   Specimen Description BLOOD LEFT HAND  Final   Special Requests   Final    BOTTLES DRAWN AEROBIC AND ANAEROBIC Blood Culture adequate volume   Culture   Final    NO GROWTH 1 DAY Performed at [facility], [address] [place], [place] [address]    Report Status PENDING  Incomplete  Respiratory Panel by PCR     Status: Abnormal   Collection Time: 04/25/17  1:04 AM  Result Value Ref Range Status   Adenovirus NOT DETECTED NOT DETECTED Final   Coronavirus 229E NOT DETECTED NOT DETECTED Final   Coronavirus HKU1 NOT DETECTED NOT DETECTED Final   Coronavirus NL63 NOT DETECTED NOT DETECTED Final   Coronavirus OC43 NOT DETECTED NOT DETECTED Final   Metapneumovirus NOT DETECTED NOT DETECTED Final   Rhinovirus / Enterovirus NOT DETECTED NOT DETECTED Final   Influenza A EQUIVOCAL (A)  NOT DETECTED Final   Influenza B NOT DETECTED NOT DETECTED Final   Parainfluenza Virus 1 NOT DETECTED NOT DETECTED Final   Parainfluenza Virus 2 NOT DETECTED NOT DETECTED Final   Parainfluenza Virus 3 NOT DETECTED NOT DETECTED Final   Parainfluenza Virus 4 NOT DETECTED NOT DETECTED Final   Respiratory Syncytial Virus NOT DETECTED NOT DETECTED Final   Bordetella pertussis NOT DETECTED NOT DETECTED Final   Chlamydophila pneumoniae NOT DETECTED NOT DETECTED Final   Mycoplasma pneumoniae NOT DETECTED NOT DETECTED Final  Culture, sputum-assessment     Status: None   Collection Time: 04/26/17 12:40 PM  Result Value Ref Range Status   Specimen Description SPUTUM  Final   Special Requests Normal  Final   Sputum evaluation   Final    THIS SPECIMEN IS ACCEPTABLE FOR SPUTUM CULTURE Performed at [facility], [address] [place], [place] [address]    Report Status 04/26/2017 FINAL  Final  Culture, respiratory (NON-Expectorated)     Status: None (Preliminary result)   Collection Time: 04/26/17 12:40 PM  Result Value Ref Range Status   Specimen Description SPUTUM  Final   Special Requests Normal Reflexed from [id number]  Final   Gram Stain   Final    FEW WBC PRESENT, PREDOMINANTLY PMN MODERATE GRAM POSITIVE COCCI IN PAIRS IN CLUSTERS FEW GRAM NEGATIVE RODS    Culture   Final    CULTURE REINCUBATED FOR BETTER GROWTH Performed at [facility], [address], [place], [place] [address]    Report Status PENDING  Incomplete     Labs: Basic Metabolic Panel: Recent Labs  Lab 04/24/17 1853 04/25/17 1217 04/26/17 0748 04/27/17 0655  NA 138 140 144 143  K 3.1* 3.1* 3.7 4.4  CL 105 107 114* 115*  CO2 20* 19* 18* 16*  GLUCOSE 89 86 88 114*  BUN 25* 25* 22* 23*  CREATININE 1.58* 1.45* 1.29* 1.35*  CALCIUM 9.2 8.9 8.8* 8.4*  MG  --  1.8 1.7  --    Liver Function Tests: Recent Labs  Lab 04/25/17 1217  AST 21  ALT 13*  ALKPHOS 66  BILITOT 0.6  PROT 6.2*  ALBUMIN 2.2*    CBC: Recent Labs  Lab 04/24/17 1853 04/25/17 1217 04/26/17 0748  WBC 18.8* 23.0* 21.5*  NEUTROABS  --  21.1* 19.8*  HGB 8.0* 7.7* 7.0*  HCT 26.9* 26.0* 23.5*  MCV 76.4* 76.9* 78.1  PLT 464* 370 350    Recent Labs    08/03/16 1651 04/24/17 1853  BNP 492.9* 2,710.4*    Principal  Problem:   Community acquired pneumonia of left lower lobe of lung (HCC) Active Problems:   Aortic valve stenosis with insufficiency   AKI (acute kidney injury) (HCC)   Sepsis (HCC)   Lobar pneumonia (HCC)   Acute hypoxemic respiratory failure (HCC)   Influenza A   Iron deficiency anemia due to chronic blood loss   DVT (deep venous thrombosis) (HCC)   Abdominal mass   Dehydration   Hypoxia   Palliative care by specialist   Goals of care, counseling/discussion   Protein-calorie malnutrition (HCC)   Time coordinating discharge: 35 minutes  Signed:  Brendia Sacks, MD [facility] 04/27/2017, 4:26 PM

## 2017-04-27 NOTE — Progress Notes (Signed)
MD notified of blood pressure of 94/50 and I asked if I should hold amlodipine.

## 2017-04-27 NOTE — Progress Notes (Signed)
Patient will go home with hospice care at discharge; All DME has been delivered to the home per daughter; they also plan to take patient home in private vehicle not ambulance at this time; CM will continue to follow for progression of care; Alexis GoodellB Ellicia Alix RN,MHA,BSN [phone]

## 2017-04-28 ENCOUNTER — Telehealth: Payer: Self-pay | Admitting: *Deleted

## 2017-04-28 ENCOUNTER — Telehealth: Payer: Self-pay

## 2017-04-28 LAB — CULTURE, RESPIRATORY W GRAM STAIN
Culture: NORMAL
Special Requests: NORMAL

## 2017-04-28 LAB — CULTURE, RESPIRATORY

## 2017-04-28 NOTE — Telephone Encounter (Signed)
Spoke with patients daughter, Claiborne BillingsLeezella.  Daughter states patient is under Hospice Care for comfort measures, requested to cancel upcoming appt with PCP. Advised to call with any additional concerns or needs.

## 2017-04-28 NOTE — Telephone Encounter (Signed)
[facility]; forwarded to provider/SLS 02/22

## 2017-04-30 LAB — CULTURE, BLOOD (ROUTINE X 2)
CULTURE: NO GROWTH
CULTURE: NO GROWTH
SPECIAL REQUESTS: ADEQUATE
Special Requests: ADEQUATE

## 2017-05-04 ENCOUNTER — Telehealth: Payer: Self-pay | Admitting: Family Medicine

## 2017-05-04 ENCOUNTER — Telehealth: Payer: Self-pay | Admitting: *Deleted

## 2017-05-04 ENCOUNTER — Encounter: Payer: Self-pay | Admitting: Family Medicine

## 2017-05-04 NOTE — Telephone Encounter (Signed)
OK 

## 2017-05-04 NOTE — Telephone Encounter (Signed)
Completed letter/faxed to Absent management service at [phone] Family notified

## 2017-05-04 NOTE — Telephone Encounter (Signed)
Received Physician Orders from Hospice; forwarded to provider/SLS 02/28

## 2017-05-04 NOTE — Telephone Encounter (Signed)
Copied from CRM [id number]. Topic: Quick Communication - See Telephone Encounter >> May 04, 2017 10:36 AM Arlyss Gandyichardson, [name], NT wrote: CRM for notification. See Telephone encounter for: Pts granddaughter calling on her moms behalf, Gloria Odom, to see if Dr. Carmelia RollerWendling can send a note over to [facility] [employer] job that she needs more time to be out of work to care for mom. She works for [employer] off [address] [place].   05/04/17.

## 2017-05-04 NOTE — Telephone Encounter (Addendum)
[facility] forwarded to provider/SLS 02/28

## 2017-05-11 ENCOUNTER — Ambulatory Visit: Payer: Medicare Other | Admitting: Family Medicine

## 2017-05-17 ENCOUNTER — Telehealth: Payer: Self-pay | Admitting: *Deleted

## 2017-05-17 NOTE — Telephone Encounter (Signed)
Received Home Health Certification and Plan of Care; forwarded to provider/SLS 03/13 

## 2017-05-18 ENCOUNTER — Telehealth: Payer: Self-pay | Admitting: Family Medicine

## 2017-05-18 NOTE — Telephone Encounter (Signed)
Hospice called to report that the patient passed away today at 12:08 AM

## 2017-05-23 ENCOUNTER — Telehealth: Payer: Self-pay | Admitting: *Deleted

## 2017-05-23 NOTE — Telephone Encounter (Addendum)
Received Certificate of Death by mail via [facility]; forwarded to provider/SLS 1) LLL Lobar Pneumonia with associated acute Hypoxic Respiratory Failure 2) Sepsis, secondary to #1 3) Influenza A 4) AKI with Hypokalemia 5) FTT with significant weight loss 6) Anemia of critical illness superimposed on chronic microcytic anemia 7) Abdominal mass with pain 8) Severe Aortic stenosis with insufficiency   Other Significant Issues: CKD, stage 4, GRF 15-29 ml/min Ovarian Mass, left  HTN HLD Uterine Fibroids Diverticulosis RLE DVT 02/2017 Protein-calorie malnutrition  Will forward to provider upon returning to office on 05/24/17 for documentation/SLS 03/19

## 2017-05-24 NOTE — Telephone Encounter (Signed)
Certificate of Death completed, signed by provider and mailed in [employer] envelope supplied by [facility]. Sent Staff Message to Mickie KayMichelle Callahan to change status in patient's chart/SLS 03/20

## 2017-06-05 DEATH — deceased

## 2018-05-22 IMAGING — DX DG CHEST 2V
2 series · 2 of 2 positions shown · non-contrast
Comparison: None.

CLINICAL DATA: Fatigue and vomiting

EXAM:
CHEST  2 VIEW

[chest lat]
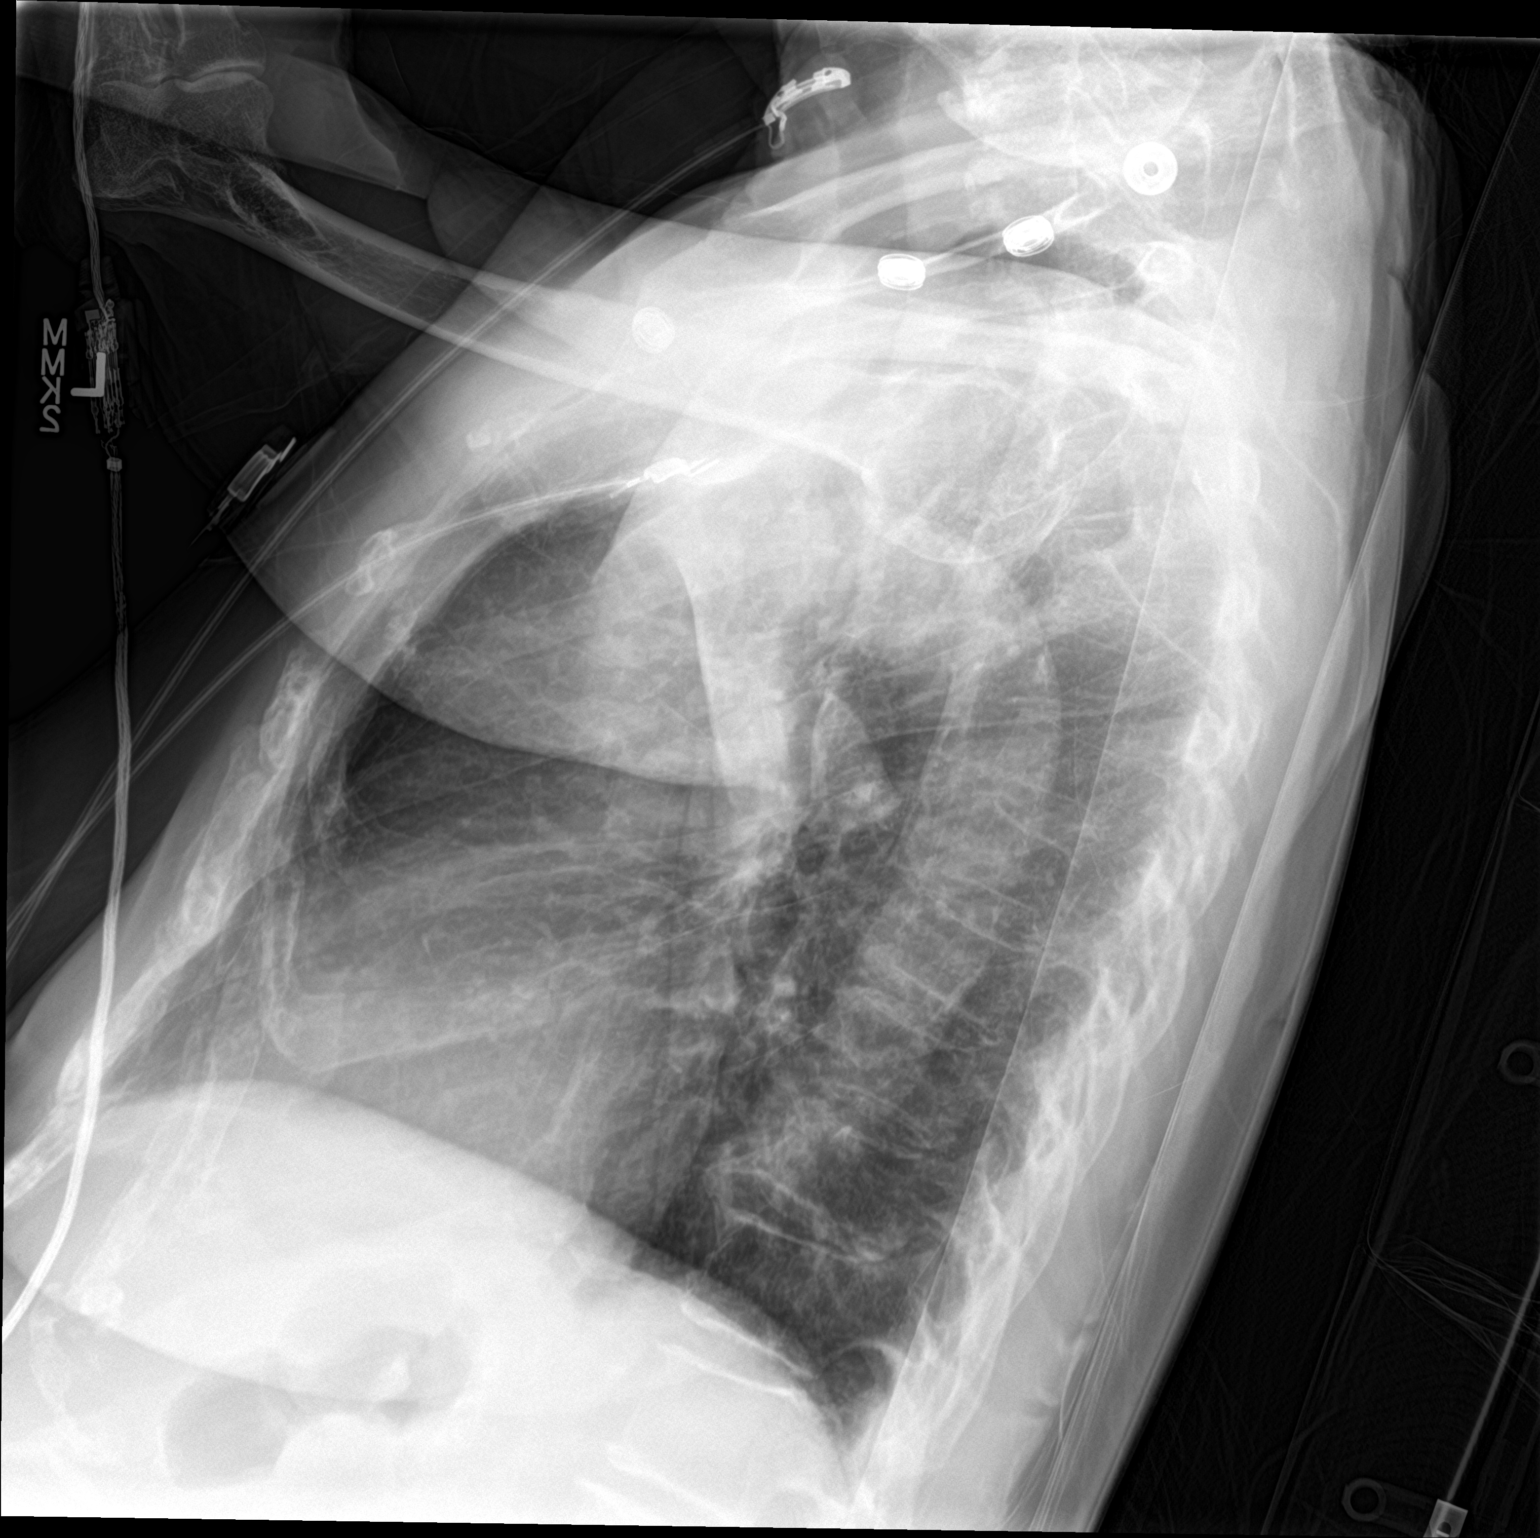

[chest ap]
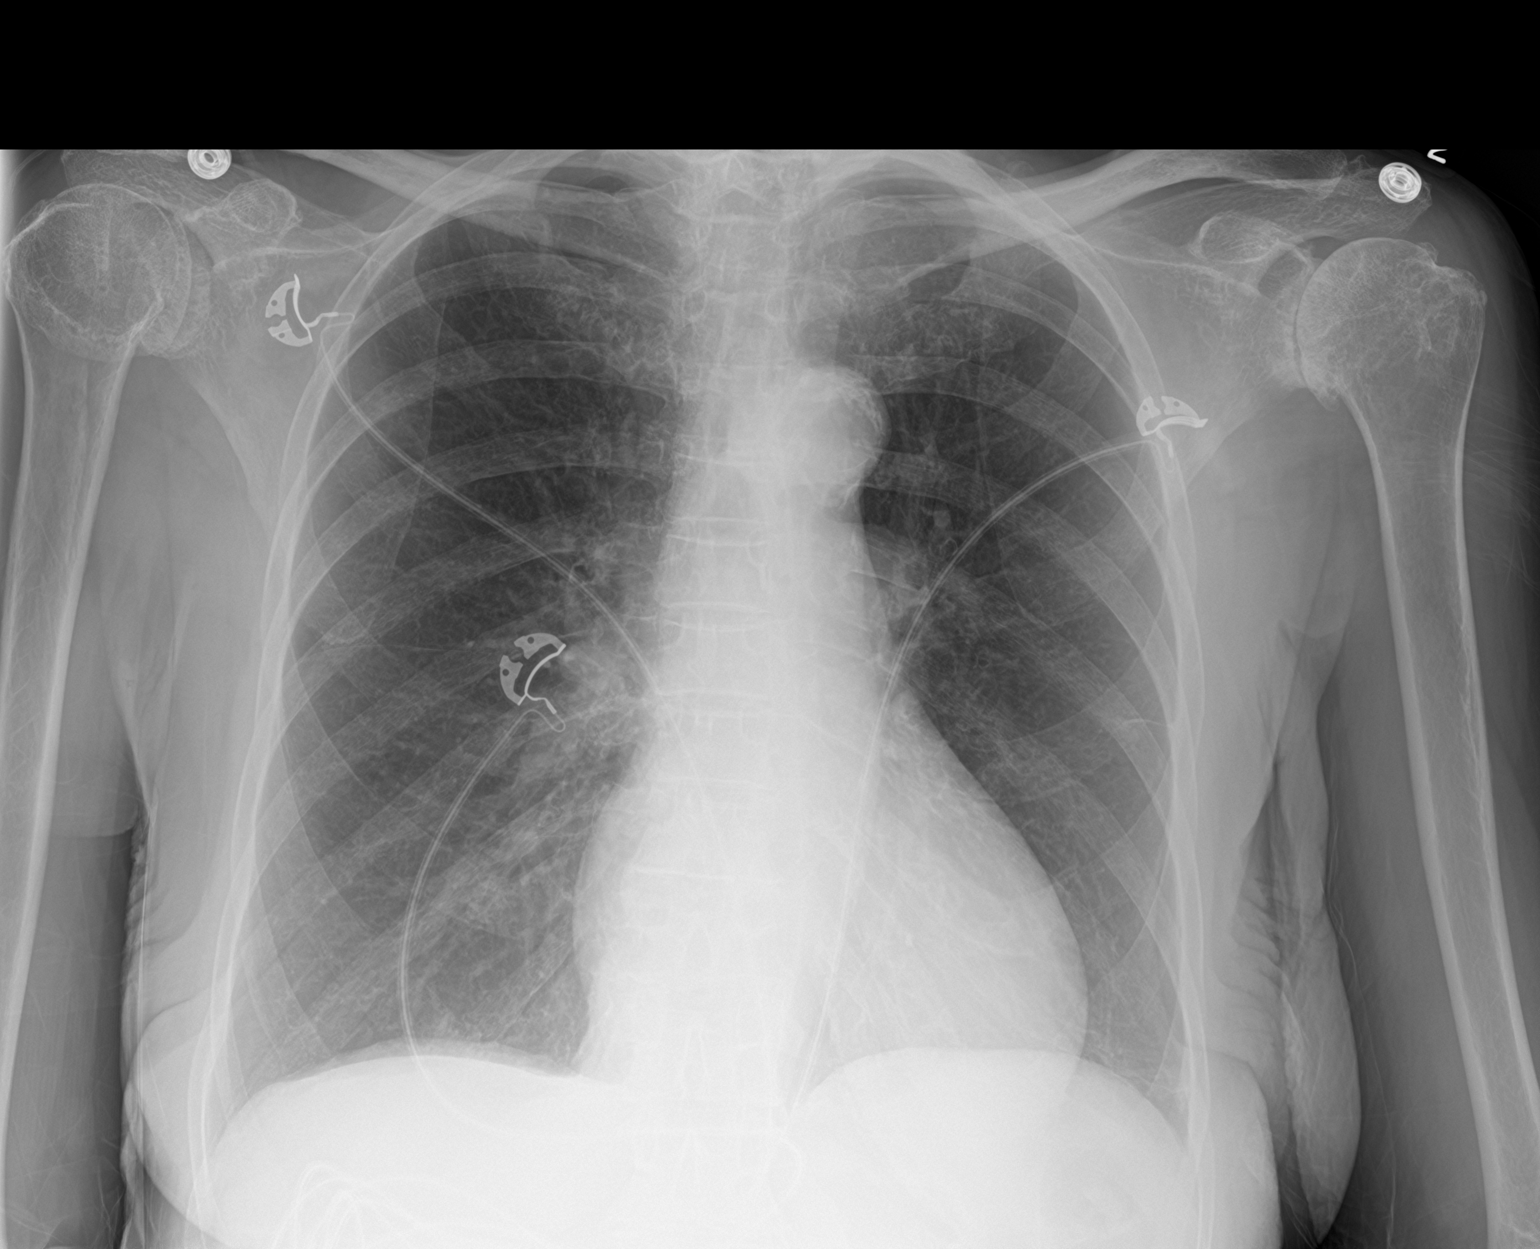

[2 of 2 positions shown; findings below may reference images not displayed]

FINDINGS: Hyperinflation. No focal infiltrate or effusion. Borderline
cardiomegaly with atherosclerosis. No pneumothorax. Advanced
degenerative changes of the bilateral shoulders.
IMPRESSION: Hyperinflation without acute infiltrate or edema.

## 2018-12-07 IMAGING — DX DG CHEST 2V
3 series · 3 of 3 positions shown · non-contrast
Comparison: 11/04/2016

CLINICAL DATA: Lower extremity edema.  Hypertension.

EXAM:
CHEST  2 VIEW

[chest lat (1 of 2)]
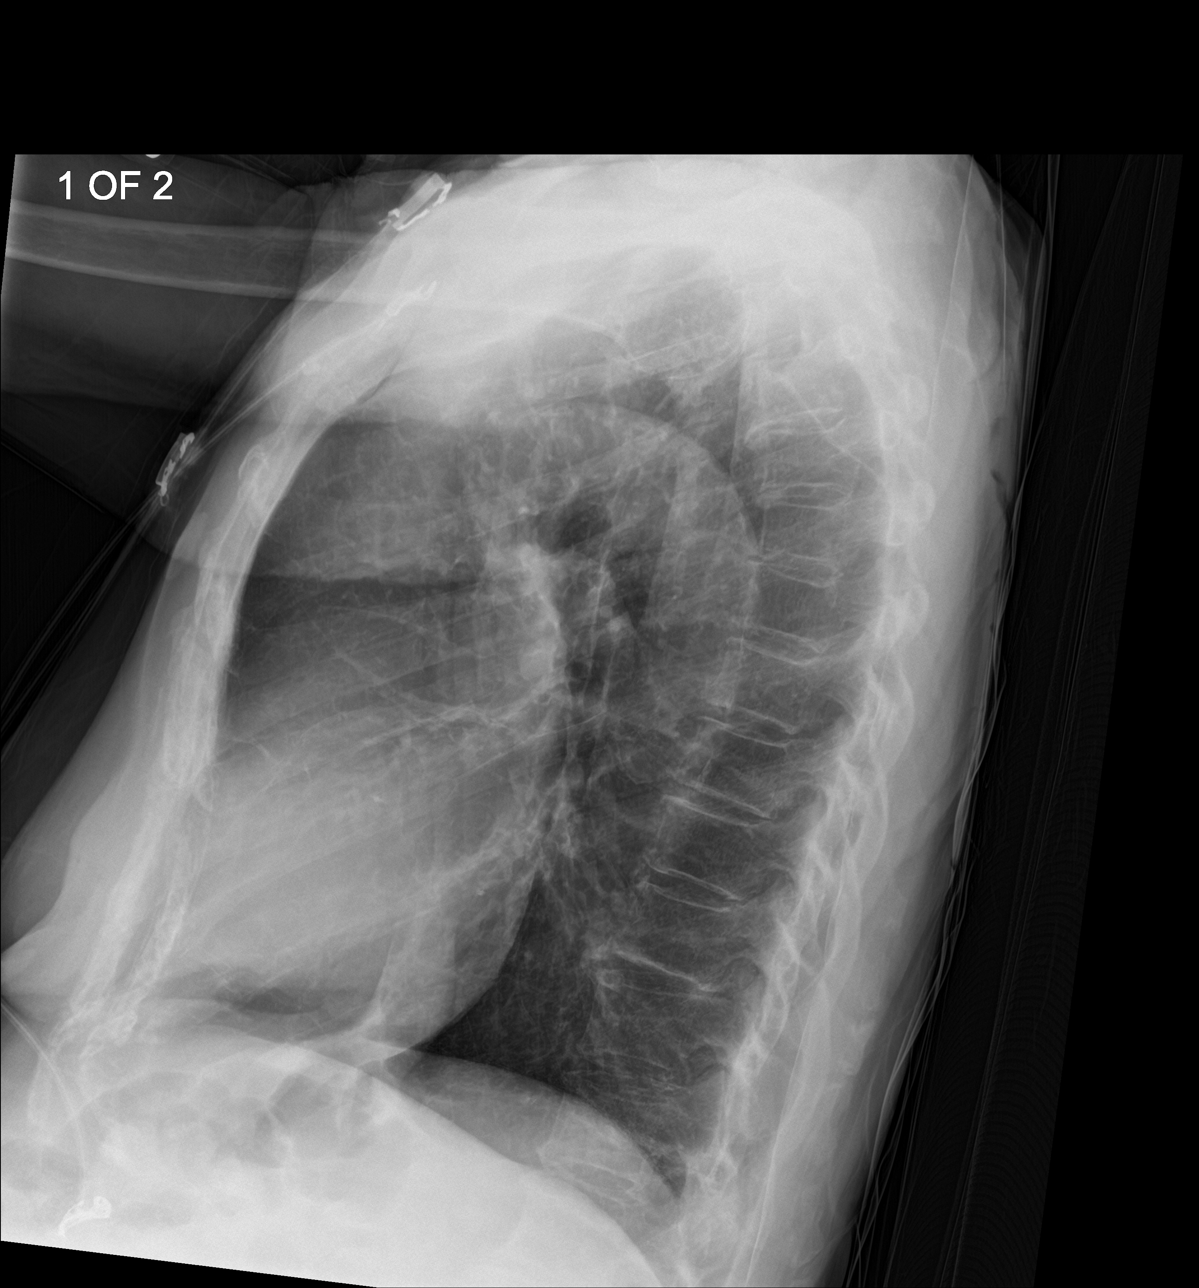

[chest ap strecther]
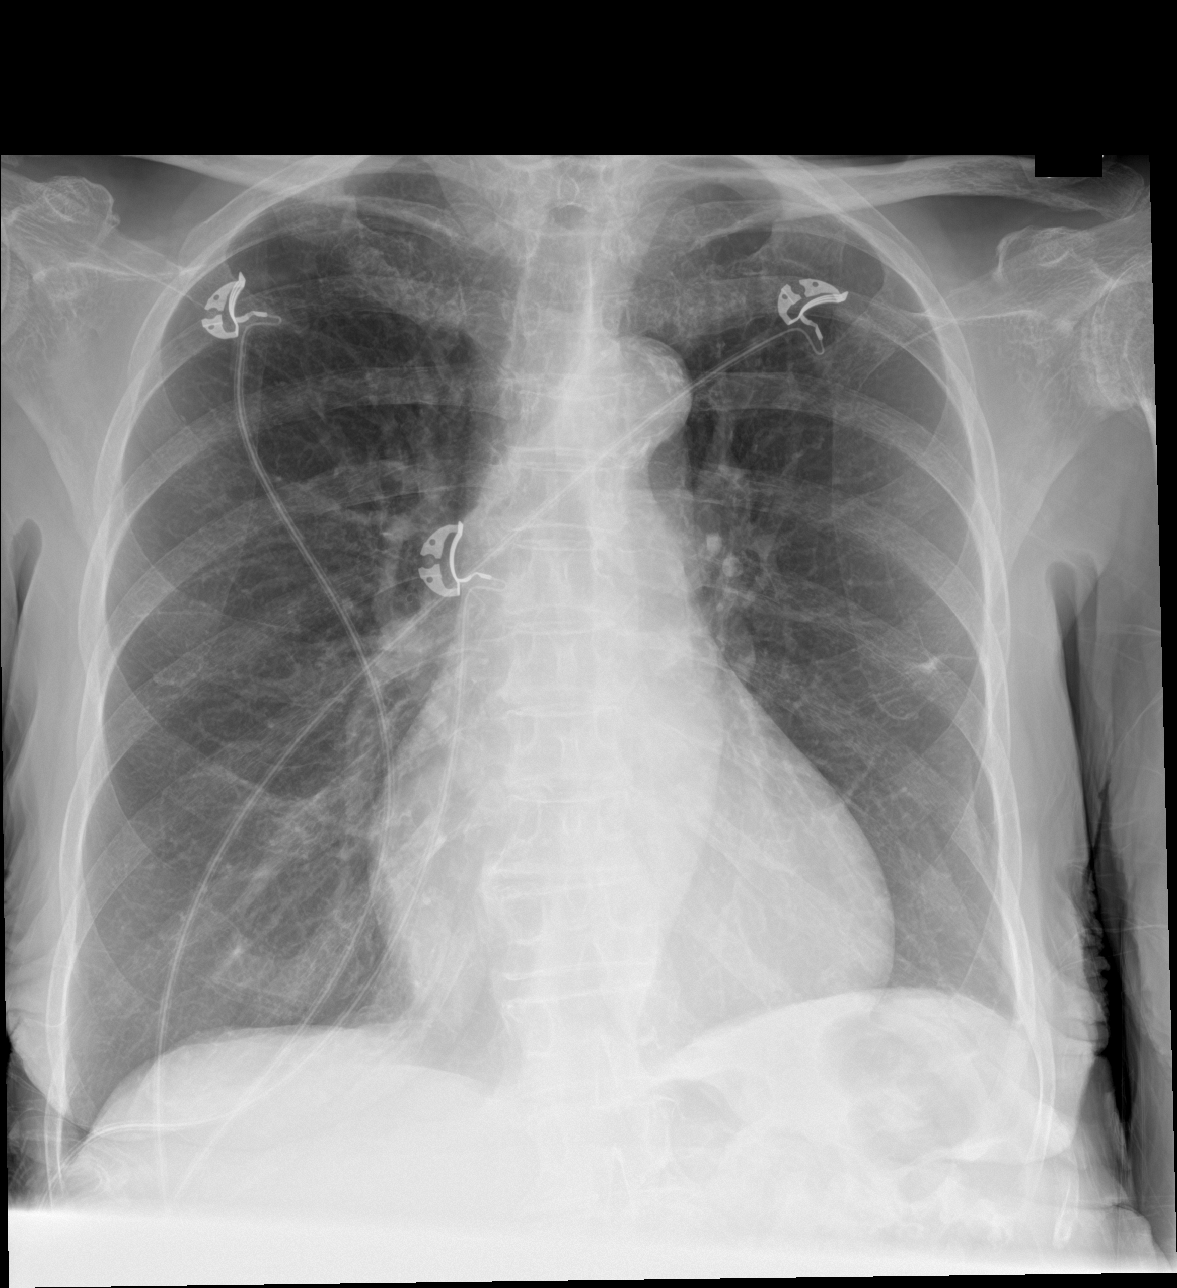

[chest lat (2 of 2)]
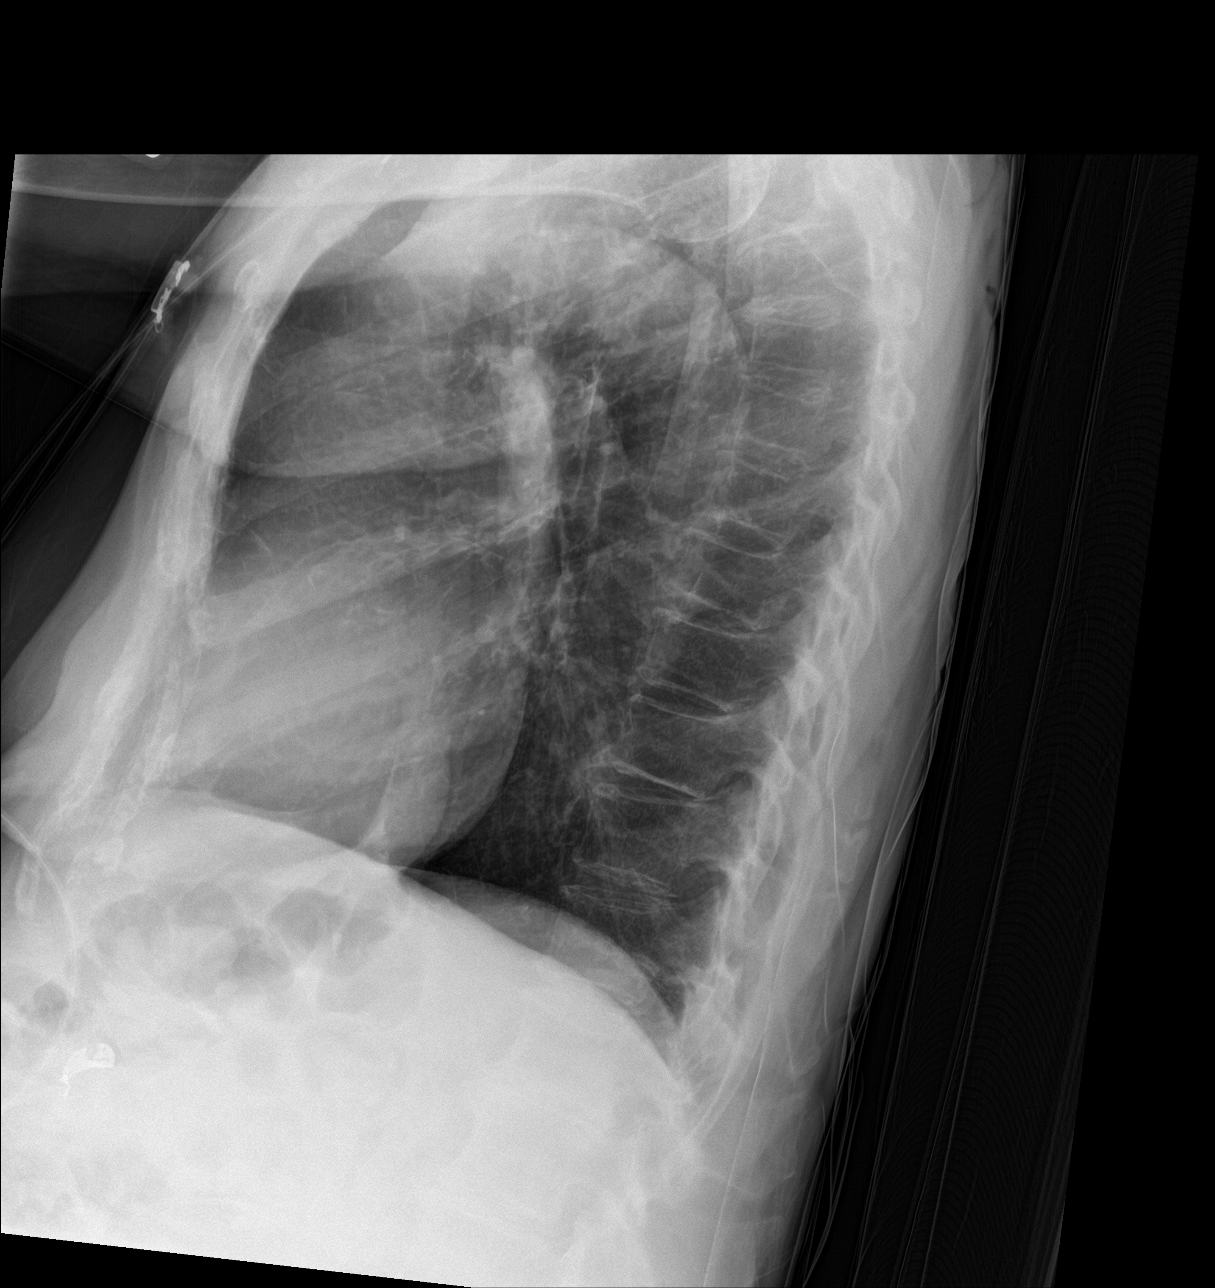

[3 of 3 positions shown; findings below may reference images not displayed]

FINDINGS: There is mild scarring in the left mid lung. There is no edema or
consolidation. Heart is upper normal in size with pulmonary
vascularity within normal limits. There is aortic atherosclerosis.
No adenopathy. There is degenerative change in each shoulder. There
is also atherosclerotic calcification in the left carotid artery.
There are skin folds on the left.
IMPRESSION: Scarring left mid lung. No edema or consolidation. There is aortic
atherosclerosis as well as left carotid artery calcification.

Aortic Atherosclerosis ([id number]).

## 2019-02-09 IMAGING — CR DG CHEST 2V
2 series · 2 of 2 positions shown · non-contrast
Comparison: 02/19/2017

CLINICAL DATA: Per pt family, they reported a SpO2 reading of 77%

EXAM:
CHEST  2 VIEW

[chest pa]
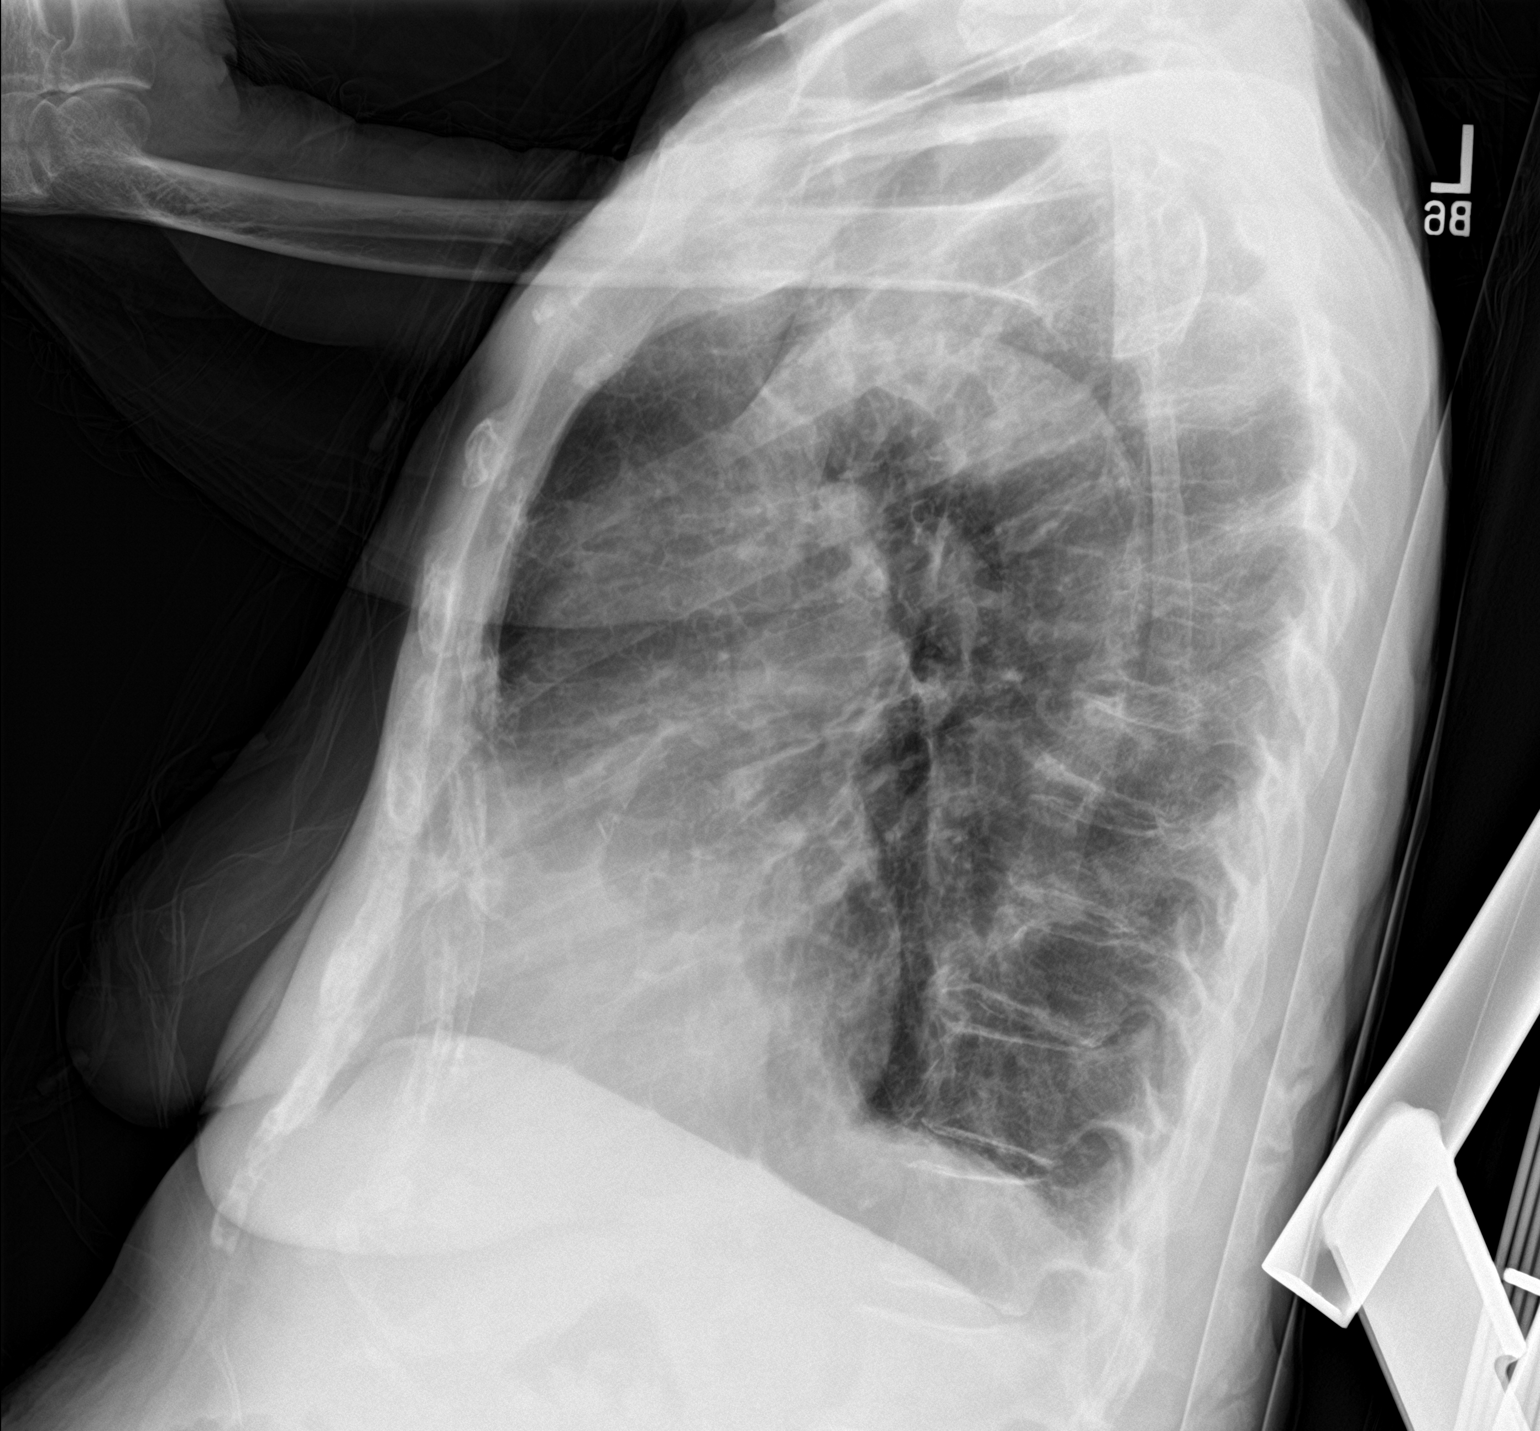

[chest ap]
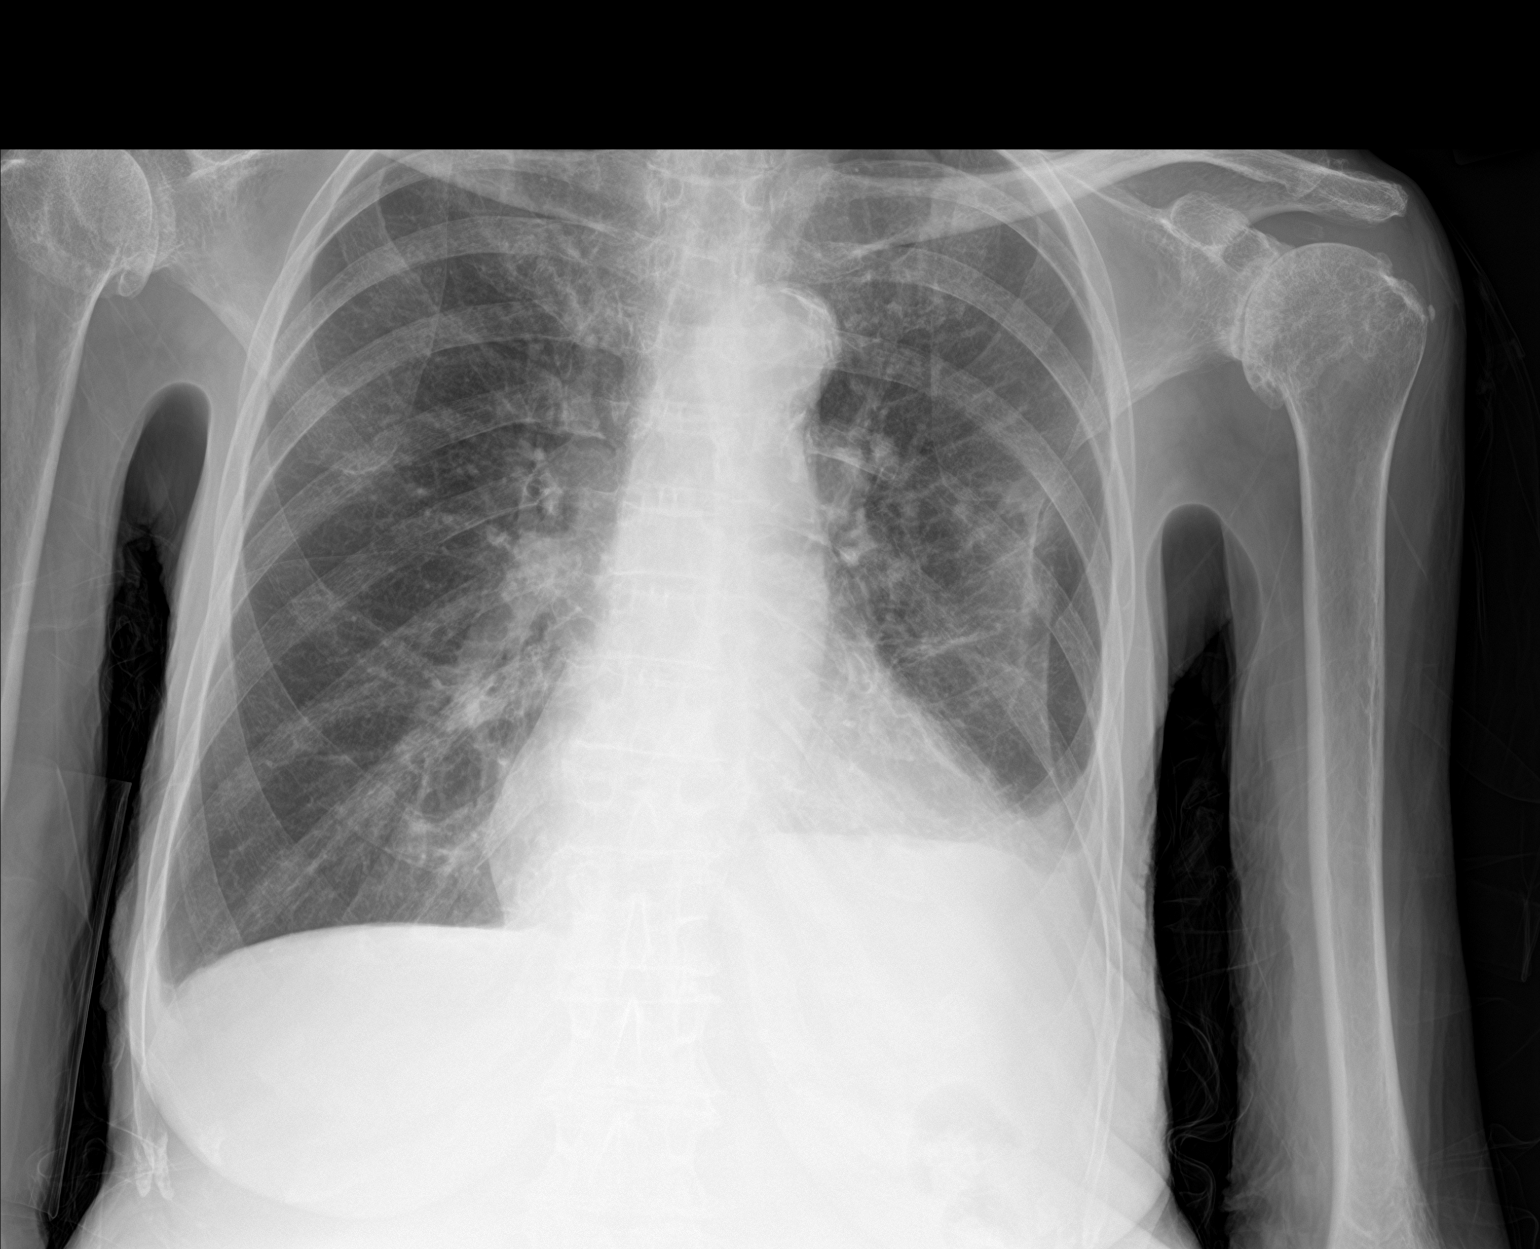

[2 of 2 positions shown; findings below may reference images not displayed]

FINDINGS: The heart is enlarged. There is perihilar peribronchial thickening.
Left pleural effusion is associated with left basilar opacity
obscuring the anterior portion of the hemidiaphragm. No pulmonary
edema.
IMPRESSION: Left lower lobe opacity and pleural effusion, most compatible with
infectious infiltrate. Followup PA and lateral chest X-ray is
recommended in 3-4 weeks following trial of antibiotic therapy to
ensure resolution and exclude underlying malignancy.

Bronchitic changes.
